# Patient Record
Sex: Male | Born: 1970 | Race: White | Hispanic: No | Marital: Married | Smoking: Former smoker
Health system: Southern US, Community
[De-identification: ages and names within clinical notes are randomized; demographics above are authoritative.]

## PROBLEM LIST (undated history)

## (undated) DIAGNOSIS — E785 Hyperlipidemia, unspecified: Secondary | ICD-10-CM

## (undated) DIAGNOSIS — N138 Other obstructive and reflux uropathy: Secondary | ICD-10-CM

## (undated) DIAGNOSIS — I10 Essential (primary) hypertension: Secondary | ICD-10-CM

## (undated) DIAGNOSIS — N401 Enlarged prostate with lower urinary tract symptoms: Secondary | ICD-10-CM

## (undated) DIAGNOSIS — E291 Testicular hypofunction: Secondary | ICD-10-CM

## (undated) HISTORY — PX: FOOT FRACTURE SURGERY: SHX645

## (undated) HISTORY — DX: Hyperlipidemia, unspecified: E78.5

## (undated) HISTORY — DX: Testicular hypofunction: E29.1

## (undated) HISTORY — DX: Essential (primary) hypertension: I10

## (undated) HISTORY — DX: Other obstructive and reflux uropathy: N13.8

## (undated) HISTORY — DX: Benign prostatic hyperplasia with lower urinary tract symptoms: N40.1

## (undated) HISTORY — PX: FRACTURE SURGERY: SHX138

## (undated) HISTORY — DX: Benign prostatic hyperplasia with lower urinary tract symptoms: N13.8

---

## 2006-09-20 ENCOUNTER — Emergency Department: Payer: Self-pay | Admitting: Emergency Medicine

## 2006-09-23 ENCOUNTER — Ambulatory Visit: Payer: Self-pay

## 2015-08-02 ENCOUNTER — Encounter: Payer: Self-pay | Admitting: *Deleted

## 2015-08-15 ENCOUNTER — Ambulatory Visit: Payer: Self-pay | Admitting: Urology

## 2015-08-21 ENCOUNTER — Ambulatory Visit (INDEPENDENT_AMBULATORY_CARE_PROVIDER_SITE_OTHER): Payer: BLUE CROSS/BLUE SHIELD | Admitting: Urology

## 2015-08-21 ENCOUNTER — Encounter: Payer: Self-pay | Admitting: Urology

## 2015-08-21 VITALS — BP 131/82 | HR 73 | Ht 68.0 in | Wt 167.7 lb

## 2015-08-21 DIAGNOSIS — N529 Male erectile dysfunction, unspecified: Secondary | ICD-10-CM

## 2015-08-21 DIAGNOSIS — N401 Enlarged prostate with lower urinary tract symptoms: Secondary | ICD-10-CM | POA: Diagnosis not present

## 2015-08-21 DIAGNOSIS — E291 Testicular hypofunction: Secondary | ICD-10-CM

## 2015-08-21 DIAGNOSIS — N138 Other obstructive and reflux uropathy: Secondary | ICD-10-CM

## 2015-08-21 MED ORDER — TESTOSTERONE 50 MG/5GM (1%) TD GEL: 5.0000 g | Freq: Every day | TRANSDERMAL | Status: DC

## 2015-08-21 NOTE — Progress Notes (Signed)
08/21/2015 10:23 AM   Benjamin Mitchell [birth year] [id number]  Referring provider: No referring provider defined for this encounter.  Chief Complaint  Patient presents with  . Benign Prostatic Hypertrophy    6 month follow up  . Erectile Dysfunction    HPI: Patient is a 44 year old white male with hypogonadism, erectile dysfunction and BPH with LUTS who presents today for 6 month follow-up.  Hypogonadism Patient presented with the symptoms of reduced libido and erectile dysfunction.  He is also experiencing a reduced incidence of spontaneous erections, a decrease in physical and work performance and a decreased energy and motivation.  This is indicated by his responses to the ADAM questionnaire.  His pretreatment testosterone level was 231 ng/dL on 08/13/2013.  He is currently managing his hypogonadism with AndroGel, 1 packet daily.         Androgen Deficiency in the Aging Male      08/21/15 1300       Androgen Deficiency in the Aging Male   Do you have a decrease in libido (sex drive) No     Do you have lack of energy Yes     Do you have a decrease in strength and/or endurance Yes     Have you lost height No     Have you noticed a decreased "enjoyment of life" No     Are you sad and/or grumpy No     Are your erections less strong No     Have you noticed a recent deterioration in your ability to play sports No     Are you falling asleep after dinner No     Has there been a recent deterioration in your work performance No         Erectile dysfunction His SHIM score is 21, which is mild ED.   He has been having difficulty with erections for two years.   His major complaint is achieving erections.  His libido is preserved.   His risk factors for ED are former smoker, age, BPH, hypogonadism, hyperlipidemia and HTN.  He denies any painful erections or curvatures with his erections.         SHIM      08/21/15 1344       SHIM: Over the last 6 months:   How do you rate  your confidence that you could get and keep an erection? Moderate     When you had erections with sexual stimulation, how often were your erections hard enough for penetration (entering your partner)? Almost Always or Always     During sexual intercourse, how often were you able to maintain your erection after you had penetrated (entered) your partner? Slightly Difficult     During sexual intercourse, how difficult was it to maintain your erection to completion of intercourse? Not Difficult     When you attempted sexual intercourse, how often was it satisfactory for you? Slightly Difficult     SHIM Total Score   SHIM 21        Score: 1-7 Severe ED 8-11 Moderate ED 12-16 Mild-Moderate ED 17-21 Mild ED 22-25 No ED   BPH WITH LUTS His major complaint today nocturia x 2.  He has had these symptoms for several years.  He denies any dysuria, hematuria or suprapubic pain.  He also denies any recent fevers, chills, nausea or vomiting.  He does not have a family history of PCa.  PMH: Past Medical History  Diagnosis Date  . HLD (hyperlipidemia)   .  Hypogonadism in male   . BPH with obstruction/lower urinary tract symptoms   . HTN (hypertension)     Surgical History: Past Surgical History  Procedure Laterality Date  . Foot fracture surgery Bilateral     Home Medications:    Medication List       This list is accurate as of: 08/21/15 11:59 PM.  Always use your most recent med list.               losartan-hydrochlorothiazide 100-25 MG tablet  Commonly known as:  HYZAAR     rosuvastatin 40 MG tablet  Commonly known as:  CRESTOR  Take 40 mg by mouth daily.     testosterone 50 MG/5GM (1%) Gel  Commonly known as:  ANDROGEL  Place 5 g onto the skin daily.        Allergies: No Known Allergies  Family History: Family History  Problem Relation Age of Onset  . Stroke Father   . Kidney disease Neg Hx   . Prostate cancer Neg Hx   . Breast cancer Mother     Social  History:  reports that he has quit smoking. He does not have any smokeless tobacco history on file. He reports that he drinks alcohol. He reports that he does not use illicit drugs.  ROS: UROLOGY Frequent Urination?: No Hard to postpone urination?: No Burning/pain with urination?: No Get up at night to urinate?: No Leakage of urine?: No Urine stream starts and stops?: No Trouble starting stream?: No Do you have to strain to urinate?: No Blood in urine?: No Urinary tract infection?: No Sexually transmitted disease?: No Injury to kidneys or bladder?: No Painful intercourse?: No Weak stream?: No Erection problems?: No Penile pain?: No  Gastrointestinal Nausea?: No Vomiting?: No Indigestion/heartburn?: No Diarrhea?: No Constipation?: No  Constitutional Fever: No Night sweats?: No Weight loss?: No Fatigue?: No  Skin Skin rash/lesions?: No Itching?: No  Eyes Blurred vision?: No Double vision?: No  Ears/Nose/Throat Sore throat?: No Sinus problems?: No  Hematologic/Lymphatic Swollen glands?: No Easy bruising?: No  Cardiovascular Leg swelling?: No Chest pain?: No  Respiratory Cough?: No Shortness of breath?: No  Endocrine Excessive thirst?: No  Musculoskeletal Back pain?: No Joint pain?: No  Neurological Headaches?: No Dizziness?: No  Psychologic Depression?: No Anxiety?: No  Physical Exam: BP 131/82 mmHg  Pulse 73  Ht 5\' 8"  (1.727 m)  Wt 167 lb 11.2 oz (76.068 kg)  BMI 25.50 kg/m2  GU: Patient with circumcised phallus.  Urethral meatus is patent.  No penile discharge. No penile lesions or rashes. Scrotum without lesions, cysts, rashes and/or edema.  Testicles are located scrotally bilaterally. No masses are appreciated in the testicles. Left and right epididymis are normal. Rectal: Patient with  normal sphincter tone. Perineum without scarring or rashes. No rectal masses are appreciated. Prostate is approximately 45 grams,  nodules are  appreciated. Seminal vesicles are normal.  Laboratory Data: PSA History:  1.0 ng/mL on 08/03/2013  1.2 ng/mL on 02/01/2014  1.4 ng/mL on 08/03/2014  1.4 ng/mL on 02/02/2015    Assessment & Plan:    1. BPH (benign prostatic hyperplasia) with LUTS:   Patient has nocturia x 2.  This has been his baseline for the last several years.  He is not interested in starting any medical treatment at this time.  He will RTC in 6 months for IPSS score, PSA and exam.  - PSA  2. Erectile dysfunction:   Patient's SHIM score is 21.  He will RTC  in 6 months for SHIM score and exam.    3. Hypogonadism:   Patient will continue the AndroGel, 1 packet daily. A refill sent to his pharmacy. He will RTC in 6 months for ADAM questionnaire and exam.  - Testosterone - Hematocrit   Return in about 6 months (around 02/19/2016) for IPSS, SHIM and ADAM.  Zara Council, [place] [address], [place], [place] [address] [phone]

## 2015-08-22 LAB — TESTOSTERONE: TESTOSTERONE: 283 ng/dL — AB (ref 348–1197)

## 2015-08-22 LAB — HEMATOCRIT: Hematocrit: 40.2 % (ref 37.5–51.0)

## 2015-08-22 LAB — PSA: PROSTATE SPECIFIC AG, SERUM: 1.3 ng/mL (ref 0.0–4.0)

## 2015-08-23 DIAGNOSIS — N401 Enlarged prostate with lower urinary tract symptoms: Principal | ICD-10-CM

## 2015-08-23 DIAGNOSIS — N529 Male erectile dysfunction, unspecified: Secondary | ICD-10-CM | POA: Insufficient documentation

## 2015-08-23 DIAGNOSIS — N138 Other obstructive and reflux uropathy: Secondary | ICD-10-CM | POA: Insufficient documentation

## 2015-08-23 DIAGNOSIS — E291 Testicular hypofunction: Secondary | ICD-10-CM | POA: Insufficient documentation

## 2015-08-24 ENCOUNTER — Telehealth: Payer: Self-pay

## 2015-08-24 DIAGNOSIS — E291 Testicular hypofunction: Secondary | ICD-10-CM

## 2015-08-24 NOTE — Telephone Encounter (Signed)
Letter was sent stating we need to know if medication needs to be refilled, labs need to be drawn in a month(orders placed), and an updated number.

## 2015-08-24 NOTE — Telephone Encounter (Signed)
-----   Message from Nori Riis, PA-C sent at 08/23/2015  1:49 PM EDT ----- PSA is good.  His testosterone is low.  I believe he said he had been out of the medication.  We can check it again in one month before 9 AM with a HCT.

## 2015-08-24 NOTE — Telephone Encounter (Signed)
Line disconnected. Will send a letter.

## 2015-09-13 ENCOUNTER — Other Ambulatory Visit: Payer: Self-pay

## 2015-09-13 DIAGNOSIS — E291 Testicular hypofunction: Secondary | ICD-10-CM

## 2015-09-13 MED ORDER — TESTOSTERONE 50 MG/5GM (1%) TD GEL: 5.0000 g | Freq: Every day | TRANSDERMAL | Status: DC

## 2015-09-18 ENCOUNTER — Other Ambulatory Visit: Payer: BLUE CROSS/BLUE SHIELD

## 2015-09-24 ENCOUNTER — Other Ambulatory Visit: Payer: BLUE CROSS/BLUE SHIELD

## 2015-09-24 DIAGNOSIS — E291 Testicular hypofunction: Secondary | ICD-10-CM

## 2015-09-25 LAB — HEMATOCRIT: HEMATOCRIT: 42.2 % (ref 37.5–51.0)

## 2015-09-25 LAB — TESTOSTERONE: TESTOSTERONE: 307 ng/dL — AB (ref 348–1197)

## 2015-10-03 ENCOUNTER — Telehealth: Payer: Self-pay

## 2015-10-03 DIAGNOSIS — E291 Testicular hypofunction: Secondary | ICD-10-CM

## 2015-10-03 MED ORDER — CLOMIPHENE CITRATE 50 MG PO TABS: ORAL_TABLET | ORAL | Status: DC

## 2015-10-03 NOTE — Telephone Encounter (Signed)
No.  We can call that medication in and he will need a morning testosterone draw in one month.  Clomid 50 mg, 1/2 tablet daily #30. No refills at this time.

## 2015-10-03 NOTE — Telephone Encounter (Signed)
LMOM-medication called into pharmacy 

## 2015-10-03 NOTE — Telephone Encounter (Signed)
Spoke with pt in reference testosterone. Pt stated he would like to give clomid a try. Does he needs labs or can medication be called in?

## 2015-10-03 NOTE — Telephone Encounter (Signed)
-----   Message from Nori Riis, PA-C sent at 10/02/2015  5:07 PM EST ----- Testosterone is still low.  He may want to try Clomid.  I have had success with that medication when testosterone therapy fails.

## 2015-12-25 ENCOUNTER — Telehealth: Payer: Self-pay | Admitting: Urology

## 2015-12-25 NOTE — Telephone Encounter (Signed)
I received a med refill error for his Clomid.  Did he get that medication?

## 2015-12-26 NOTE — Telephone Encounter (Signed)
LMOM

## 2015-12-26 NOTE — Telephone Encounter (Signed)
Spoke with pt in reference to clomid. Pt stated he will be picking up medication this afternoon. Made aware we received an error message and if medication is not at pharmacy to let us know. Pt voiced understanding.

## 2015-12-27 ENCOUNTER — Other Ambulatory Visit: Payer: Self-pay

## 2015-12-27 DIAGNOSIS — E291 Testicular hypofunction: Secondary | ICD-10-CM

## 2015-12-27 MED ORDER — CLOMIPHENE CITRATE 50 MG PO TABS: ORAL_TABLET | ORAL | Status: DC

## 2015-12-27 NOTE — Progress Notes (Signed)
Pt called and stated [facility] did not receive medication script. Pt requested medication be sent to [facility]. Medication was sent and pt made aware.

## 2016-02-12 ENCOUNTER — Other Ambulatory Visit: Payer: BLUE CROSS/BLUE SHIELD

## 2016-02-12 DIAGNOSIS — E291 Testicular hypofunction: Secondary | ICD-10-CM

## 2016-02-13 LAB — TESTOSTERONE: TESTOSTERONE: 507 ng/dL (ref 348–1197)

## 2016-02-13 LAB — HEMATOCRIT: Hematocrit: 41.3 % (ref 37.5–51.0)

## 2016-02-19 ENCOUNTER — Encounter: Payer: Self-pay | Admitting: Urology

## 2016-02-19 ENCOUNTER — Ambulatory Visit (INDEPENDENT_AMBULATORY_CARE_PROVIDER_SITE_OTHER): Payer: BLUE CROSS/BLUE SHIELD | Admitting: Urology

## 2016-02-19 VITALS — BP 137/89 | HR 72 | Ht 68.0 in | Wt 165.4 lb

## 2016-02-19 DIAGNOSIS — I10 Essential (primary) hypertension: Secondary | ICD-10-CM | POA: Insufficient documentation

## 2016-02-19 DIAGNOSIS — N401 Enlarged prostate with lower urinary tract symptoms: Secondary | ICD-10-CM | POA: Diagnosis not present

## 2016-02-19 DIAGNOSIS — E291 Testicular hypofunction: Secondary | ICD-10-CM | POA: Diagnosis not present

## 2016-02-19 DIAGNOSIS — N529 Male erectile dysfunction, unspecified: Secondary | ICD-10-CM

## 2016-02-19 DIAGNOSIS — E785 Hyperlipidemia, unspecified: Secondary | ICD-10-CM | POA: Insufficient documentation

## 2016-02-19 DIAGNOSIS — N138 Other obstructive and reflux uropathy: Secondary | ICD-10-CM

## 2016-02-19 DIAGNOSIS — Z8781 Personal history of (healed) traumatic fracture: Secondary | ICD-10-CM | POA: Insufficient documentation

## 2016-02-19 MED ORDER — CLOMIPHENE CITRATE 50 MG PO TABS: ORAL_TABLET | ORAL | Status: DC

## 2016-02-19 NOTE — Progress Notes (Signed)
9:04 AM   Benjamin Mitchell Feb 07, 1971 [id number]  Referring provider: Idelle Crouch, MD [facility] [place], [place] [address]  Chief Complaint  Patient presents with  . Follow-up    BPH, hypogonadism    HPI: Patient is a 45 year old Caucasian male with hypogonadism, erectile dysfunction and BPH with LUTS who presents today for 6 month follow-up.   Hypogonadism Patient is experiencing a lack of energy.   This is indicated by his responses to the ADAM questionnaire.  His pretreatment testosterone level was 231 ng/dL on 08/13/2013.   He is currently managing his hypogonadism with AndroGel, 1 packet daily.          Androgen Deficiency in the Aging Male      02/19/16 0800       Androgen Deficiency in the Aging Male   Do you have a decrease in libido (sex drive) No     Do you have lack of energy Yes     Do you have a decrease in strength and/or endurance No     Have you lost height No     Have you noticed a decreased "enjoyment of life" No     Are you sad and/or grumpy No     Are your erections less strong No     Have you noticed a recent deterioration in your ability to play sports No     Are you falling asleep after dinner No     Has there been a recent deterioration in your work performance No        Erectile dysfunction His SHIM score is 23, which is no ED.   He has been having difficulty with erections for two years.   His major complaint is achieving erections.  His libido is preserved.   His risk factors for ED are former smoker, age, BPH, hypogonadism, hyperlipidemia and HTN.  He denies any painful erections or curvatures with his erections.         SHIM      02/19/16 0854       SHIM: Over the last 6 months:   How do you rate your confidence that you could get and keep an erection? Moderate     When you had erections with sexual stimulation, how often were your erections hard enough for penetration (entering your partner)? Almost  Always or Always     During sexual intercourse, how often were you able to maintain your erection after you had penetrated (entered) your partner? Not Difficult     During sexual intercourse, how difficult was it to maintain your erection to completion of intercourse? Not Difficult     When you attempted sexual intercourse, how often was it satisfactory for you? Not Difficult     SHIM Total Score   SHIM 23        Score: 1-7 Severe ED 8-11 Moderate ED 12-16 Mild-Moderate ED 17-21 Mild ED 22-25 No ED   BPH WITH LUTS His IPSS score today is 3, which is mild lower urinary tract symptomatology. He is delighted with his quality life due to his urinary symptoms.  His major complaint today nocturia x 2.  He has had these symptoms for as long as he can remember.  He denies any dysuria, hematuria or suprapubic pain.  He also denies any recent fevers, chills, nausea or vomiting.  He does not have a family history of PCa.      IPSS  02/19/16 0800       International Prostate Symptom Score   How often have you had the sensation of not emptying your bladder? Not at All     How often have you had to urinate less than every two hours? Less than 1 in 5 times     How often have you found you stopped and started again several times when you urinated? Not at All     How often have you found it difficult to postpone urination? Not at All     How often have you had a weak urinary stream? Not at All     How often have you had to strain to start urination? Not at All     How many times did you typically get up at night to urinate? 2 Times     Total IPSS Score 3     Quality of Life due to urinary symptoms   If you were to spend the rest of your life with your urinary condition just the way it is now how would you feel about that? Delighted        Score:  1-7 Mild 8-19 Moderate 20-35 Severe   PMH: Past Medical History  Diagnosis Date  . HLD (hyperlipidemia)   . Hypogonadism in male   .  BPH with obstruction/lower urinary tract symptoms   . HTN (hypertension)     Surgical History: Past Surgical History  Procedure Laterality Date  . Foot fracture surgery Bilateral     Home Medications:    Medication List       This list is accurate as of: 02/19/16 11:59 PM.  Always use your most recent med list.               clomiPHENE 50 MG tablet  Commonly known as:  CLOMID  1/2 tab daily     losartan-hydrochlorothiazide 100-25 MG tablet  Commonly known as:  HYZAAR  Reported on 02/19/2016     losartan-hydrochlorothiazide 100-12.5 MG tablet  Commonly known as:  HYZAAR  Take 1 tablet by mouth daily.     rosuvastatin 40 MG tablet  Commonly known as:  CRESTOR  Take 40 mg by mouth daily.     testosterone 50 MG/5GM (1%) Gel  Commonly known as:  ANDROGEL  Place 5 g onto the skin daily.        Allergies: No Known Allergies  Family History: Family History  Problem Relation Age of Onset  . Stroke Father   . Kidney disease Neg Hx   . Prostate cancer Neg Hx   . Breast cancer Mother     Social History:  reports that he has quit smoking. He does not have any smokeless tobacco history on file. He reports that he drinks alcohol. He reports that he does not use illicit drugs.  ROS: UROLOGY Frequent Urination?: No Hard to postpone urination?: No Burning/pain with urination?: No Get up at night to urinate?: No Leakage of urine?: No Urine stream starts and stops?: No Trouble starting stream?: No Do you have to strain to urinate?: No Blood in urine?: No Urinary tract infection?: No Sexually transmitted disease?: No Injury to kidneys or bladder?: No Painful intercourse?: No Weak stream?: No Erection problems?: No Penile pain?: No  Gastrointestinal Nausea?: No Vomiting?: No Indigestion/heartburn?: No Diarrhea?: No Constipation?: No  Constitutional Fever: No Night sweats?: No Weight loss?: No Fatigue?: No  Skin Skin rash/lesions?: No Itching?:  No  Eyes Blurred vision?: No Double vision?: No  Ears/Nose/Throat Sore throat?: No Sinus problems?: No  Hematologic/Lymphatic Swollen glands?: No Easy bruising?: No  Cardiovascular Leg swelling?: No Chest pain?: No  Respiratory Cough?: No Shortness of breath?: No  Endocrine Excessive thirst?: No  Musculoskeletal Back pain?: No Joint pain?: No  Neurological Headaches?: No Dizziness?: No  Psychologic Depression?: No Anxiety?: No  Physical Exam: BP 137/89 mmHg  Pulse 72  Ht 5\' 8"  (1.727 m)  Wt 165 lb 6.4 oz (75.025 kg)  BMI 25.15 kg/m2  GU: Patient with circumcised phallus.  Urethral meatus is patent.  No penile discharge. No penile lesions or rashes. Scrotum without lesions, cysts, rashes and/or edema.  Testicles are located scrotally bilaterally. No masses are appreciated in the testicles. Left and right epididymis are normal. Rectal: Patient with  normal sphincter tone. Perineum without scarring or rashes. No rectal masses are appreciated. Prostate is approximately 45 grams,  nodules are appreciated. Seminal vesicles are normal.  Laboratory Data: PSA History:  1.0 ng/mL on 08/03/2013  1.2 ng/mL on 02/01/2014  1.4 ng/mL on 08/03/2014  1.4 ng/mL on 02/02/2015  1.3 ng/mL on 08/21/2015  1.7 ng/mL on 02/19/2016      Assessment & Plan:    1. BPH (benign prostatic hyperplasia) with LUTS:   IPSS score is 3/0.  Patient has nocturia x 2.  This has been his baseline for the last several years.  He is not interested in starting any medical treatment at this time.  We will continue to monitor as testosterone therapy can increase the size of the prostate and potentially worsen LUTS.  He will RTC in 6 months for IPSS score, PSA and exam.  - PSA  2. Erectile dysfunction:   Patient's SHIM score is 23.  We will continue to monitor as testosterone therapy can affect ED.  He will RTC in 6 months for SHIM score and exam.    3. Hypogonadism:   Patient's testosterone levels  have not been in the therapeutic range.  He would like to try another treatment modality.  I did mention that some men have had success using clomid for hypogonadism.  It does seem to be more successful in younger men, but there are incidences of good results in middle-aged men.  I explained that it is used in male infertility to stimulate the testicles to make more testosterone/sperm.  There has been no long term data on side effects, but some urologists has been having success with this medication.  His LFT's are normal.  He would like to start Clomid therapy at this time.  I have sent a prescription for Clomid 50 mg, 1/2 tablet daily to his pharmacy.  He will RTC after thirty days of Clomid for a morning testosterone draw before 9 AM.      - Testosterone - Hematocrit   Return in about 1 month (around 03/20/2016) for for morning testosterone before 9 AM.  Zara Council, [facility] [address] [place], [place] [address] [phone]

## 2016-02-21 ENCOUNTER — Telehealth: Payer: Self-pay

## 2016-02-21 NOTE — Telephone Encounter (Signed)
LMOM- labs are normal. 

## 2016-02-21 NOTE — Telephone Encounter (Signed)
-----   Message from Nori Riis, PA-C sent at 02/20/2016  8:09 AM EDT ----- Labs are normal.

## 2016-02-26 LAB — ESTRADIOL, FREE
Estradiol, Serum, MS: 32 pg/mL
Free Estradiol, Percent: 3.2 %
Free Estradiol, Serum: 1 pg/mL

## 2016-02-26 LAB — PSA: Prostate Specific Ag, Serum: 1.7 ng/mL (ref 0.0–4.0)

## 2016-02-26 LAB — TSH: TSH: 2.33 u[IU]/mL (ref 0.450–4.500)

## 2016-08-15 ENCOUNTER — Other Ambulatory Visit: Payer: Self-pay

## 2016-08-15 DIAGNOSIS — N400 Enlarged prostate without lower urinary tract symptoms: Secondary | ICD-10-CM

## 2016-08-15 DIAGNOSIS — E291 Testicular hypofunction: Secondary | ICD-10-CM

## 2016-08-18 ENCOUNTER — Other Ambulatory Visit: Payer: BLUE CROSS/BLUE SHIELD

## 2016-08-18 DIAGNOSIS — E291 Testicular hypofunction: Secondary | ICD-10-CM

## 2016-08-19 LAB — HEPATIC FUNCTION PANEL
ALT: 21 IU/L (ref 0–44)
AST: 17 IU/L (ref 0–40)
Albumin: 4.5 g/dL (ref 3.5–5.5)
Alkaline Phosphatase: 48 IU/L (ref 39–117)
BILIRUBIN TOTAL: 0.5 mg/dL (ref 0.0–1.2)
BILIRUBIN, DIRECT: 0.14 mg/dL (ref 0.00–0.40)
Total Protein: 7.2 g/dL (ref 6.0–8.5)

## 2016-08-19 LAB — TESTOSTERONE: TESTOSTERONE: 626 ng/dL (ref 264–916)

## 2016-08-19 LAB — PSA: Prostate Specific Ag, Serum: 1.5 ng/mL (ref 0.0–4.0)

## 2016-08-19 LAB — HEMATOCRIT: Hematocrit: 43.1 % (ref 37.5–51.0)

## 2016-08-20 ENCOUNTER — Ambulatory Visit: Payer: BLUE CROSS/BLUE SHIELD | Admitting: Urology

## 2016-08-20 ENCOUNTER — Encounter: Payer: Self-pay | Admitting: Urology

## 2016-08-20 VITALS — BP 148/87 | HR 75 | Ht 68.0 in | Wt 167.3 lb

## 2016-08-20 DIAGNOSIS — E291 Testicular hypofunction: Secondary | ICD-10-CM

## 2016-08-20 DIAGNOSIS — N138 Other obstructive and reflux uropathy: Secondary | ICD-10-CM

## 2016-08-20 DIAGNOSIS — N529 Male erectile dysfunction, unspecified: Secondary | ICD-10-CM | POA: Diagnosis not present

## 2016-08-20 DIAGNOSIS — N401 Enlarged prostate with lower urinary tract symptoms: Secondary | ICD-10-CM | POA: Diagnosis not present

## 2016-08-20 MED ORDER — CLOMIPHENE CITRATE 50 MG PO TABS: ORAL_TABLET | ORAL | 6 refills | Status: DC

## 2016-08-20 NOTE — Progress Notes (Signed)
9:07 AM   Benjamin Mitchell 1970/12/24 RE:[id number]  Referring provider: Idelle Crouch, MD [facility] [place], [place] [address]  Chief Complaint  Patient presents with  . Hypogonadism    6 month follow up    HPI: Patient is a 45 year old Caucasian male with hypogonadism, erectile dysfunction and BPH with LUTS who presents today for 6 month follow-up.  Hypogonadism Patient is experiencing erections being less strong.   This is indicated by his responses to the ADAM questionnaire.  He is still having spontaneous erections at night.  He does not have sleep apnea.   His current testosterone level was 626 ng/dL on 08/18/2016.  He is currently managing his hypogonadism with Clomid 50 mg, 1/2 daily.        Androgen Deficiency in the Aging Male    [place] Name 08/20/16 0900         Androgen Deficiency in the Aging Male   Do you have a decrease in libido (sex drive) No     Do you have lack of energy No     Do you have a decrease in strength and/or endurance No     Have you lost height No     Have you noticed a decreased "enjoyment of life" No     Are you sad and/or grumpy No     Are your erections less strong Yes     Have you noticed a recent deterioration in your ability to play sports No     Are you falling asleep after dinner No     Has there been a recent deterioration in your work performance No        Erectile dysfunction His SHIM score is 21, which is mild ED.   His previous SHIM was 23.  He has been having difficulty with erections for two years.   His major complaint is achieving erections.  His libido is preserved.   His risk factors for ED are former smoker, age, BPH, hypogonadism, hyperlipidemia and HTN.  He denies any painful erections or curvatures with his erections.         SHIM    Row Name 08/20/16 0857         SHIM: Over the last 6 months:   How do you rate your confidence that you could get and keep an erection? Moderate     When you had erections with sexual stimulation, how often were your erections hard enough for penetration (entering your partner)? Most Times (much more than half the time)     During sexual intercourse, how often were you able to maintain your erection after you had penetrated (entered) your partner? Slightly Difficult     During sexual intercourse, how difficult was it to maintain your erection to completion of intercourse? Not Difficult     When you attempted sexual intercourse, how often was it satisfactory for you? Not Difficult       SHIM Total Score   SHIM 21        Score: 1-7 Severe ED 8-11 Moderate ED 12-16 Mild-Moderate ED 17-21 Mild ED 22-25 No ED   BPH WITH LUTS His IPSS score today is 5, which is mild lower urinary tract symptomatology. He is pleased with his quality life due to his urinary symptoms. His previous IPSS score was 3/0.   His major complaint today nocturia x 2.  He has had these symptoms for as long as he can remember.  He denies any dysuria, hematuria or suprapubic pain.  He also denies any recent fevers, chills, nausea or vomiting.  He does not have a family history of PCa.      IPSS    Row Name 08/20/16 0800         International Prostate Symptom Score   How often have you had the sensation of not emptying your bladder? Not at All     How often have you had to urinate less than every two hours? Less than half the time     How often have you found you stopped and started again several times when you urinated? Not at All     How often have you found it difficult to postpone urination? Not at All     How often have you had a weak urinary stream? Less than 1 in 5 times     How often have you had to strain to start urination? Not at All     How many times did you typically get up at night to urinate? 2 Times     Total IPSS Score 5       Quality of Life due to urinary symptoms   If you were to spend the rest of your life with your urinary condition just  the way it is now how would you feel about that? Pleased        Score:  1-7 Mild 8-19 Moderate 20-35 Severe   PMH: Past Medical History:  Diagnosis Date  . BPH with obstruction/lower urinary tract symptoms   . HLD (hyperlipidemia)   . HTN (hypertension)   . Hypogonadism in male     Surgical History: Past Surgical History:  Procedure Laterality Date  . FOOT FRACTURE SURGERY Bilateral     Home Medications:    Medication List       Accurate as of 08/20/16  9:07 AM. Always use your most recent med list.          clomiPHENE 50 MG tablet Commonly known as:  CLOMID 1/2 tab daily   losartan-hydrochlorothiazide 100-25 MG tablet Commonly known as:  HYZAAR Reported on 02/19/2016   losartan-hydrochlorothiazide 100-12.5 MG tablet Commonly known as:  HYZAAR Take 1 tablet by mouth daily.   rosuvastatin 40 MG tablet Commonly known as:  CRESTOR Take 40 mg by mouth daily.   testosterone 50 MG/5GM (1%) Gel Commonly known as:  ANDROGEL Place 5 g onto the skin daily.       Allergies: No Known Allergies  Family History: Family History  Problem Relation Age of Onset  . Stroke Father   . Breast cancer Mother   . Kidney disease Neg Hx   . Prostate cancer Neg Hx     Social History:  reports that he has quit smoking. He has quit using smokeless tobacco. He reports that he drinks alcohol. He reports that he does not use drugs.  ROS: UROLOGY Frequent Urination?: No Hard to postpone urination?: No Burning/pain with urination?: No Get up at night to urinate?: No Leakage of urine?: No Urine stream starts and stops?: No Trouble starting stream?: No Do you have to strain to urinate?: No Blood in urine?: No Urinary tract infection?: No Sexually transmitted disease?: No Injury to kidneys or bladder?: No Painful intercourse?: No Weak stream?: No Erection problems?: No Penile pain?: No  Gastrointestinal Nausea?: No Vomiting?: No Indigestion/heartburn?:  No Diarrhea?: No Constipation?: No  Constitutional Fever: No Night sweats?: No Weight loss?: No Fatigue?: No  Skin Skin rash/lesions?: No Itching?: No  Eyes Blurred vision?: No Double vision?: No  Ears/Nose/Throat Sore throat?: No Sinus problems?: No  Hematologic/Lymphatic Swollen glands?: No Easy bruising?: No  Cardiovascular Leg swelling?: No Chest pain?: No  Respiratory Cough?: No Shortness of breath?: No  Endocrine Excessive thirst?: No  Musculoskeletal Back pain?: No Joint pain?: No  Neurological Headaches?: No Dizziness?: No  Psychologic Depression?: No Anxiety?: No  Physical Exam: BP (!) 148/87   Pulse 75   Ht 5\' 8"  (1.727 m)   Wt 167 lb 4.8 oz (75.9 kg)   BMI 25.44 kg/m   GU: Patient with circumcised phallus.  Urethral meatus is patent.  No penile discharge. No penile lesions or rashes. Scrotum without lesions, cysts, rashes and/or edema.  Testicles are located scrotally bilaterally. No masses are appreciated in the testicles. Left and right epididymis are normal. Rectal: Patient with  normal sphincter tone. Perineum without scarring or rashes. No rectal masses are appreciated. Prostate is approximately 45 grams,  nodules are appreciated. Seminal vesicles are normal.  Laboratory Data: PSA History:  1.0 ng/mL on 08/03/2013  1.2 ng/mL on 02/01/2014  1.4 ng/mL on 08/03/2014  1.4 ng/mL on 02/02/2015  1.3 ng/mL on 08/21/2015  1.7 ng/mL on 02/19/2016  1.5 ng/mL on 08/18/2016    Assessment & Plan:    1. Hypogonadism:     -most recent testosterone level is 626 ng/dL on 08/18/2016; LFT's normal  -continue Clomid 50 mg, 1/2 tablet daily-refills given today  -RTC in 6 months for HCT, testosterone, PSA, LFT's, ADAM and exam  2. BPH with LUTS  - IPSS score is 5/1, it is worsening  - Continue conservative management, avoiding bladder irritants and timed voiding's  - PSA 1.5 and stable  - RTC in 6 months for IPSS, PSA and exam, as testosterone  therapy can cause prostate enlargement and worsen LUTS  3. Erectile dysfunction:     -SHIM score is 21, it is worsening  -RTC in 6 months for SHIM score and exam, as testosterone therapy can affect erections  Return for PSA, LFT's, HCT, testosterone (before 10 AM), ADAM, IPSS, SHIM and exam.  Zara Council, [facility] [address] [place], [place] [address] [phone]

## 2017-02-11 ENCOUNTER — Other Ambulatory Visit: Payer: Self-pay

## 2017-02-11 ENCOUNTER — Other Ambulatory Visit: Payer: BLUE CROSS/BLUE SHIELD

## 2017-02-11 DIAGNOSIS — E291 Testicular hypofunction: Secondary | ICD-10-CM

## 2017-02-12 LAB — HEPATIC FUNCTION PANEL
ALBUMIN: 4.4 g/dL (ref 3.5–5.5)
ALT: 20 IU/L (ref 0–44)
AST: 20 IU/L (ref 0–40)
Alkaline Phosphatase: 44 IU/L (ref 39–117)
BILIRUBIN TOTAL: 0.4 mg/dL (ref 0.0–1.2)
BILIRUBIN, DIRECT: 0.13 mg/dL (ref 0.00–0.40)
Total Protein: 7.1 g/dL (ref 6.0–8.5)

## 2017-02-12 LAB — HEMATOCRIT: Hematocrit: 43.5 % (ref 37.5–51.0)

## 2017-02-12 LAB — TESTOSTERONE: Testosterone: 597 ng/dL (ref 264–916)

## 2017-02-12 LAB — PSA: Prostate Specific Ag, Serum: 1.8 ng/mL (ref 0.0–4.0)

## 2017-02-17 NOTE — Progress Notes (Signed)
8:59 AM   Benjamin Mitchell Aug 19, 1971 [id number]  Referring provider: Idelle Crouch, MD [place] [facility] [place], [place] [address]  Chief Complaint  Patient presents with  . Benign Prostatic Hypertrophy    HPI: Patient is a 46 year old Caucasian male with hypogonadism, erectile dysfunction and BPH with LUTS who presents today for 6 month follow-up.  Testosterone deficiency Patient is experiencing failing asleep after dinner.   This is indicated by his responses to the ADAM questionnaire.  He is still having spontaneous erections at night.  He does not have sleep apnea.   His current testosterone level was 597 ng/dL on 02/11/2017.  He is currently managing his hypogonadism with Clomid 50 mg, 1/2 daily.        Androgen Deficiency in the Aging Male    [place] Name 02/18/17 0800         Androgen Deficiency in the Aging Male   Do you have a decrease in libido (sex drive) No     Do you have lack of energy No     Do you have a decrease in strength and/or endurance No     Have you lost height No     Have you noticed a decreased "enjoyment of life" No     Are you sad and/or grumpy No     Are your erections less strong No     Have you noticed a recent deterioration in your ability to play sports No     Are you falling asleep after dinner Yes     Has there been a recent deterioration in your work performance No        Erectile dysfunction His SHIM score is 20, which is mild ED.   His previous SHIM was 21.  He has been having difficulty with erections for two years.   His major complaint is achieving erections.  His libido is preserved.   His risk factors for ED are former smoker, age, BPH, hypogonadism, hyperlipidemia and HTN.  He denies any painful erections or curvatures with his erections.         [place] Name 02/18/17 [phone]         SHIM: Over the last 6 months:   How do you rate your confidence that you could get and keep an erection? Moderate       When you had erections with sexual stimulation, how often were your erections hard enough for penetration (entering your partner)? Most Times (much more than half the time)     During sexual intercourse, how often were you able to maintain your erection after you had penetrated (entered) your partner? Most Times (much more than half the time)     During sexual intercourse, how difficult was it to maintain your erection to completion of intercourse? Not Difficult     When you attempted sexual intercourse, how often was it satisfactory for you? Most Times (much more than half the time)       SHIM Total Score   SHIM 20        Score: 1-7 Severe ED 8-11 Moderate ED 12-16 Mild-Moderate ED 17-21 Mild ED 22-25 No ED   BPH WITH LUTS His IPSS score today is 4, which is mild lower urinary tract symptomatology. He is pleased with his quality life due to his urinary symptoms. His previous IPSS score was 5/0.   His major complaint today nocturia x 2.  He has had  these symptoms for as long as he can remember.  He denies any dysuria, hematuria or suprapubic pain.  He also denies any recent fevers, chills, nausea or vomiting.  He does not have a family history of PCa.      IPSS    Row Name 02/18/17 0800         International Prostate Symptom Score   How often have you had the sensation of not emptying your bladder? Not at All     How often have you had to urinate less than every two hours? Less than 1 in 5 times     How often have you found you stopped and started again several times when you urinated? Not at All     How often have you found it difficult to postpone urination? Less than 1 in 5 times     How often have you had a weak urinary stream? Not at All     How often have you had to strain to start urination? Not at All     How many times did you typically get up at night to urinate? 2 Times     Total IPSS Score 4       Quality of Life due to urinary symptoms   If you were to spend the  rest of your life with your urinary condition just the way it is now how would you feel about that? Pleased        Score:  1-7 Mild 8-19 Moderate 20-35 Severe   PMH: Past Medical History:  Diagnosis Date  . BPH with obstruction/lower urinary tract symptoms   . HLD (hyperlipidemia)   . HTN (hypertension)   . Hypogonadism in male     Surgical History: Past Surgical History:  Procedure Laterality Date  . FOOT FRACTURE SURGERY Bilateral     Home Medications:  Allergies as of 02/18/2017   No Known Allergies     Medication List       Accurate as of 02/18/17  8:59 AM. Always use your most recent med list.          clomiPHENE 50 MG tablet Commonly known as:  CLOMID 1/2 tab daily   losartan-hydrochlorothiazide 100-12.5 MG tablet Commonly known as:  HYZAAR Take 1 tablet by mouth daily.   rosuvastatin 40 MG tablet Commonly known as:  CRESTOR Take 40 mg by mouth daily.       Allergies: No Known Allergies  Family History: Family History  Problem Relation Age of Onset  . Stroke Father   . Breast cancer Mother   . Kidney disease Neg Hx   . Prostate cancer Neg Hx     Social History:  reports that he has quit smoking. He has quit using smokeless tobacco. He reports that he drinks alcohol. He reports that he does not use drugs.  ROS: UROLOGY Frequent Urination?: No Hard to postpone urination?: No Burning/pain with urination?: No Get up at night to urinate?: No Leakage of urine?: No Urine stream starts and stops?: No Trouble starting stream?: No Do you have to strain to urinate?: No Blood in urine?: No Urinary tract infection?: No Sexually transmitted disease?: No Injury to kidneys or bladder?: No Painful intercourse?: No Weak stream?: No Erection problems?: No Penile pain?: No  Gastrointestinal Nausea?: No Vomiting?: No Indigestion/heartburn?: No Diarrhea?: No Constipation?: No  Constitutional Fever: No Night sweats?: No Weight loss?:  No Fatigue?: No  Skin Skin rash/lesions?: No Itching?: No  Eyes Blurred vision?: No  Double vision?: No  Ears/Nose/Throat Sore throat?: No Sinus problems?: No  Hematologic/Lymphatic Swollen glands?: No Easy bruising?: No  Cardiovascular Leg swelling?: No Chest pain?: No  Respiratory Cough?: No Shortness of breath?: No  Endocrine Excessive thirst?: No  Musculoskeletal Back pain?: No Joint pain?: No  Neurological Headaches?: No Dizziness?: No  Psychologic Depression?: No Anxiety?: No  Physical Exam: BP 121/78 (BP Location: Left Arm, Patient Position: Sitting, Cuff Size: Normal)   Pulse 76   Ht 5\' 8"  (1.727 m)   Wt 166 lb 9.6 oz (75.6 kg)   BMI 25.33 kg/m   GU: Patient with circumcised phallus.  Urethral meatus is patent.  No penile discharge. No penile lesions or rashes. Scrotum without lesions, cysts, rashes and/or edema.  Testicles are located scrotally bilaterally. No masses are appreciated in the testicles. Left and right epididymis are normal. Rectal: Patient with  normal sphincter tone. Perineum without scarring or rashes. No rectal masses are appreciated. Prostate is approximately 45 grams,  nodules are appreciated. Seminal vesicles are normal.  Laboratory Data: PSA History:  1.0 ng/mL on 08/03/2013  1.2 ng/mL on 02/01/2014  1.4 ng/mL on 08/03/2014  1.4 ng/mL on 02/02/2015  1.3 ng/mL on 08/21/2015  1.7 ng/mL on 02/19/2016  1.5 ng/mL on 08/18/2016  1.8 ng/mL on 02/11/2017    Assessment & Plan:    1. Testosterone deficiency:     -most recent testosterone level is 597 ng/dL on 08/18/2016; LFT's and HCT normal  -continue Clomid 50 mg, 1/2 tablet daily-refills given today  -RTC in 6 months for HCT, testosterone, PSA, LFT's, ADAM and exam  2. BPH with LUTS  - IPSS score is 4/1, it is improving  - Continue conservative management, avoiding bladder irritants and timed voiding's  - PSA 1.8 and stable  - RTC in 6 months for IPSS, PSA and exam, as  testosterone therapy can cause prostate enlargement and worsen LUTS  3. Erectile dysfunction:     -SHIM score is 20, it is worsening  -RTC in 6 months for SHIM score and exam, as testosterone therapy can affect erections  Return in about 6 months (around 08/20/2017) for PSA, LFT's, HCT, testosterone (before 10 AM), ADAM, IPSS, SHIM and exam.  Zara Council, [facility]  [facility] [address] [place], [place] [address] [phone]

## 2017-02-18 ENCOUNTER — Ambulatory Visit (INDEPENDENT_AMBULATORY_CARE_PROVIDER_SITE_OTHER): Payer: BLUE CROSS/BLUE SHIELD | Admitting: Urology

## 2017-02-18 ENCOUNTER — Encounter: Payer: Self-pay | Admitting: Urology

## 2017-02-18 VITALS — BP 121/78 | HR 76 | Ht 68.0 in | Wt 166.6 lb

## 2017-02-18 DIAGNOSIS — N529 Male erectile dysfunction, unspecified: Secondary | ICD-10-CM | POA: Diagnosis not present

## 2017-02-18 DIAGNOSIS — E291 Testicular hypofunction: Secondary | ICD-10-CM

## 2017-02-18 DIAGNOSIS — E349 Endocrine disorder, unspecified: Secondary | ICD-10-CM

## 2017-02-18 DIAGNOSIS — N138 Other obstructive and reflux uropathy: Secondary | ICD-10-CM | POA: Diagnosis not present

## 2017-02-18 DIAGNOSIS — N401 Enlarged prostate with lower urinary tract symptoms: Secondary | ICD-10-CM

## 2017-02-18 MED ORDER — CLOMIPHENE CITRATE 50 MG PO TABS: ORAL_TABLET | ORAL | 6 refills | Status: DC

## 2017-08-18 ENCOUNTER — Other Ambulatory Visit: Payer: Self-pay

## 2017-08-18 ENCOUNTER — Other Ambulatory Visit: Payer: BLUE CROSS/BLUE SHIELD

## 2017-08-18 DIAGNOSIS — E291 Testicular hypofunction: Secondary | ICD-10-CM

## 2017-08-18 NOTE — Progress Notes (Signed)
9:25 AM   COLIE FUGITT [birth year] [id number]  Referring provider: Idelle Crouch, MD [facility] [place], [place] [address]  Chief Complaint  Patient presents with  . Benign Prostatic Hypertrophy  . Other    testosterone deficiency     HPI: Patient is a 46 year old Caucasian male with testosterone deficiency, erectile dysfunction and BPH with LUTS who presents today for 6 month follow-up.  Testosterone deficiency Patient is not experiencing Symptoms of testosterone deficiency at this time.   This is indicated by his responses to the ADAM questionnaire.  He is still having spontaneous erections at night.  He does not have sleep apnea.   His current testosterone level was 522 ng/dL on 08/18/2017.  LFT's, HBG and HCT are normal.  He is currently managing his hypogonadism with Clomid 50 mg, 1/2 daily.        Androgen Deficiency in the Aging Male    [place] Name 08/20/17 0900         Androgen Deficiency in the Aging Male   Do you have a decrease in libido (sex drive) No     Do you have lack of energy No     Do you have a decrease in strength and/or endurance No     Have you lost height No     Have you noticed a decreased "enjoyment of life" No     Are you sad and/or grumpy No     Are your erections less strong No     Have you noticed a recent deterioration in your ability to play sports No     Are you falling asleep after dinner No     Has there been a recent deterioration in your work performance No        Erectile dysfunction His SHIM score is 22, which is no ED.   His previous SHIM was 20.  He has been having difficulty with erections for three years.   His major complaint is achieving erections.  His libido is preserved.   His risk factors for ED are former smoker, age, BPH, testosterone deficiency, hyperlipidemia and HTN.  He denies any painful erections or curvatures with his erections.         SHIM    [place] Name 08/20/17 0925         SHIM: Over the last 6 months:   How do you rate your confidence that you could get and keep an erection? High     When you had erections with sexual stimulation, how often were your erections hard enough for penetration (entering your partner)? Most Times (much more than half the time)     During sexual intercourse, how often were you able to maintain your erection after you had penetrated (entered) your partner? Most Times (much more than half the time)     During sexual intercourse, how difficult was it to maintain your erection to completion of intercourse? Not Difficult     When you attempted sexual intercourse, how often was it satisfactory for you? Almost Always or Always       SHIM Total Score   SHIM 22        Score: 1-7 Severe ED 8-11 Moderate ED 12-16 Mild-Moderate ED 17-21 Mild ED 22-25 No ED   BPH WITH LUTS His IPSS score today is 3, which is mild lower urinary tract symptomatology. He is pleased with his quality life due to his urinary symptoms. His previous IPSS  score was 4/1.  His major complaint today nocturia x 2.  He has had these symptoms for as long as he can remember.  He denies any dysuria, hematuria or suprapubic pain.  He also denies any recent fevers, chills, nausea or vomiting.  He does not have a family history of PCa.      IPSS    Row Name 08/20/17 0900         International Prostate Symptom Score   How often have you had the sensation of not emptying your bladder? Not at All     How often have you had to urinate less than every two hours? Less than 1 in 5 times     How often have you found you stopped and started again several times when you urinated? Not at All     How often have you found it difficult to postpone urination? Not at All     How often have you had a weak urinary stream? Not at All     How often have you had to strain to start urination? Not at All     How many times did you typically get up at night to urinate? 2 Times     Total IPSS  Score 3       Quality of Life due to urinary symptoms   If you were to spend the rest of your life with your urinary condition just the way it is now how would you feel about that? Pleased        Score:  1-7 Mild 8-19 Moderate 20-35 Severe   PMH: Past Medical History:  Diagnosis Date  . BPH with obstruction/lower urinary tract symptoms   . HLD (hyperlipidemia)   . HTN (hypertension)   . Hypogonadism in male     Surgical History: Past Surgical History:  Procedure Laterality Date  . FOOT FRACTURE SURGERY Bilateral     Home Medications:  Allergies as of 08/20/2017   No Known Allergies     Medication List       Accurate as of 08/20/17  9:25 AM. Always use your most recent med list.          clomiPHENE 50 MG tablet Commonly known as:  CLOMID 1/2 tab daily   losartan-hydrochlorothiazide 100-12.5 MG tablet Commonly known as:  HYZAAR Take 1 tablet by mouth daily.   rosuvastatin 40 MG tablet Commonly known as:  CRESTOR Take 40 mg by mouth daily.       Allergies: No Known Allergies  Family History: Family History  Problem Relation Age of Onset  . Stroke Father   . Breast cancer Mother   . Kidney disease Neg Hx   . Prostate cancer Neg Hx     Social History:  reports that he has quit smoking. He has quit using smokeless tobacco. He reports that he drinks alcohol. He reports that he does not use drugs.  ROS: UROLOGY Frequent Urination?: No Hard to postpone urination?: No Burning/pain with urination?: No Get up at night to urinate?: No Leakage of urine?: No Urine stream starts and stops?: No Trouble starting stream?: No Do you have to strain to urinate?: No Blood in urine?: No Urinary tract infection?: No Sexually transmitted disease?: No Injury to kidneys or bladder?: No Painful intercourse?: No Weak stream?: No Erection problems?: No Penile pain?: No  Gastrointestinal Nausea?: No Vomiting?: No Indigestion/heartburn?: No Diarrhea?:  No Constipation?: No  Constitutional Fever: No Night sweats?: No Weight loss?: No Fatigue?: No  Skin Skin rash/lesions?: No Itching?: No  Eyes Blurred vision?: No Double vision?: No  Ears/Nose/Throat Sore throat?: No Sinus problems?: No  Hematologic/Lymphatic Swollen glands?: No Easy bruising?: No  Cardiovascular Leg swelling?: No Chest pain?: No  Respiratory Cough?: No Shortness of breath?: No  Endocrine Excessive thirst?: No  Musculoskeletal Back pain?: No Joint pain?: No  Neurological Headaches?: No Dizziness?: No  Psychologic Depression?: No Anxiety?: No  Physical Exam: BP (!) 146/88   Pulse 76   Ht 5\' 8"  (1.727 m)   Wt 164 lb (74.4 kg)   BMI 24.94 kg/m   GU: Patient with circumcised phallus.  Urethral meatus is patent.  No penile discharge. No penile lesions or rashes. Scrotum without lesions, cysts, rashes and/or edema.  Testicles are located scrotally bilaterally. No masses are appreciated in the testicles. Left and right epididymis are normal. Rectal: Patient with  normal sphincter tone. Perineum without scarring or rashes. No rectal masses are appreciated. Prostate is approximately 45 grams,  nodules are appreciated. Seminal vesicles are normal.  Laboratory Data: PSA History:  1.0 ng/mL on 08/03/2013  1.2 ng/mL on 02/01/2014  1.4 ng/mL on 08/03/2014  1.4 ng/mL on 02/02/2015  1.3 ng/mL on 08/21/2015  1.7 ng/mL on 02/19/2016  1.5 ng/mL on 08/18/2016  1.8 ng/mL on 02/11/2017  1.9 ng/mL on 08/18/2017    Assessment & Plan:    1. Testosterone deficiency:     -most recent testosterone level is 522 ng/dL on 08/18/2017; LFT's, HBG and HCT normal  -continue Clomid 50 mg, 1/2 tablet daily-refills given today  -RTC in 6 months for HCT, HBG, testosterone, PSA, LFT's, ADAM and exam  2. BPH with LUTS  - IPSS score is 4/1, it is improving  - Continue conservative management, avoiding bladder irritants and timed voiding's  - PSA 1.9 and  stable  - RTC in 6 months for IPSS, PSA and exam, as testosterone therapy can cause prostate enlargement and worsen LUTS  3. Erectile dysfunction:     -SHIM score is 20, it is worsening  -RTC in 6 months for SHIM score and exam, as testosterone therapy can affect erections  Return in about 1 year (around 08/20/2018) for PSA, LFT's, HCT, HBG, testosterone (before 10 AM), ADAM, IPSS, SHIM and exam.  Zara Council, [facility]  [facility] [address] [place], [place] [address] [phone]

## 2017-08-19 LAB — PSA: PROSTATE SPECIFIC AG, SERUM: 1.9 ng/mL (ref 0.0–4.0)

## 2017-08-19 LAB — TESTOSTERONE: Testosterone: 522 ng/dL (ref 264–916)

## 2017-08-19 LAB — HEPATIC FUNCTION PANEL
ALBUMIN: 4.3 g/dL (ref 3.5–5.5)
ALK PHOS: 46 IU/L (ref 39–117)
ALT: 20 IU/L (ref 0–44)
AST: 18 IU/L (ref 0–40)
Bilirubin Total: 0.5 mg/dL (ref 0.0–1.2)
Bilirubin, Direct: 0.12 mg/dL (ref 0.00–0.40)
TOTAL PROTEIN: 6.8 g/dL (ref 6.0–8.5)

## 2017-08-19 LAB — HEMATOCRIT: Hematocrit: 42.5 % (ref 37.5–51.0)

## 2017-08-19 LAB — HEMOGLOBIN: Hemoglobin: 14.4 g/dL (ref 13.0–17.7)

## 2017-08-20 ENCOUNTER — Ambulatory Visit (INDEPENDENT_AMBULATORY_CARE_PROVIDER_SITE_OTHER): Payer: BLUE CROSS/BLUE SHIELD | Admitting: Urology

## 2017-08-20 ENCOUNTER — Encounter: Payer: Self-pay | Admitting: Urology

## 2017-08-20 VITALS — BP 146/88 | HR 76 | Ht 68.0 in | Wt 164.0 lb

## 2017-08-20 DIAGNOSIS — N529 Male erectile dysfunction, unspecified: Secondary | ICD-10-CM | POA: Diagnosis not present

## 2017-08-20 DIAGNOSIS — N138 Other obstructive and reflux uropathy: Secondary | ICD-10-CM | POA: Diagnosis not present

## 2017-08-20 DIAGNOSIS — E291 Testicular hypofunction: Secondary | ICD-10-CM

## 2017-08-20 DIAGNOSIS — E349 Endocrine disorder, unspecified: Secondary | ICD-10-CM | POA: Diagnosis not present

## 2017-08-20 DIAGNOSIS — N401 Enlarged prostate with lower urinary tract symptoms: Secondary | ICD-10-CM | POA: Diagnosis not present

## 2017-08-20 MED ORDER — CLOMIPHENE CITRATE 50 MG PO TABS: ORAL_TABLET | ORAL | 6 refills | Status: DC

## 2017-08-20 NOTE — Addendum Note (Signed)
Addended by: Toniann Fail C on: 08/20/2017 11:18 AM   Modules accepted: Orders

## 2018-02-18 ENCOUNTER — Telehealth: Payer: Self-pay | Admitting: Urology

## 2018-02-18 NOTE — Telephone Encounter (Signed)
Pt called office inquiring that his Clomid will not be available until September, pt would like to know if there is a substitute available until clomid is available. Please advise. Thanks.

## 2018-02-18 NOTE — Telephone Encounter (Signed)
Unfortunately, there is no substitute for Clomid (clomiphene).  At this time, they can choose to forego testosterone therapy until the Clomid is available or change their treatment to a exogenous testosterone product.  This is where the testosterone treatment becomes mostly insurance driven as they will need to contact their insurance company to see what testosterone products are covered through their insurance carrier.      Once that is determined, we can prescribe the product that is covered through their insurance or if the insurance covers multiple products we can have further discussion on which one they would like to try first.

## 2018-02-19 NOTE — Telephone Encounter (Signed)
Spoke with pt in reference to clomid shortage. Pt stated that he would prefer to wait until Sept when clomid is available again.

## 2018-08-10 ENCOUNTER — Other Ambulatory Visit: Payer: BLUE CROSS/BLUE SHIELD

## 2018-08-10 DIAGNOSIS — N138 Other obstructive and reflux uropathy: Secondary | ICD-10-CM

## 2018-08-10 DIAGNOSIS — N401 Enlarged prostate with lower urinary tract symptoms: Principal | ICD-10-CM

## 2018-08-10 DIAGNOSIS — E349 Endocrine disorder, unspecified: Secondary | ICD-10-CM

## 2018-08-11 LAB — HEMOGLOBIN: Hemoglobin: 14.8 g/dL (ref 13.0–17.7)

## 2018-08-11 LAB — HEPATIC FUNCTION PANEL
ALBUMIN: 4.5 g/dL (ref 3.5–5.5)
ALT: 23 IU/L (ref 0–44)
AST: 18 IU/L (ref 0–40)
Alkaline Phosphatase: 49 IU/L (ref 39–117)
BILIRUBIN TOTAL: 0.5 mg/dL (ref 0.0–1.2)
Bilirubin, Direct: 0.12 mg/dL (ref 0.00–0.40)
TOTAL PROTEIN: 7 g/dL (ref 6.0–8.5)

## 2018-08-11 LAB — PSA: PROSTATE SPECIFIC AG, SERUM: 2.2 ng/mL (ref 0.0–4.0)

## 2018-08-11 LAB — TESTOSTERONE: Testosterone: 226 ng/dL — ABNORMAL LOW (ref 264–916)

## 2018-08-11 LAB — HEMATOCRIT: Hematocrit: 43 % (ref 37.5–51.0)

## 2018-08-17 ENCOUNTER — Encounter: Payer: Self-pay | Admitting: Urology

## 2018-08-17 ENCOUNTER — Ambulatory Visit: Payer: BLUE CROSS/BLUE SHIELD | Admitting: Urology

## 2018-08-17 VITALS — BP 137/89 | HR 73 | Ht 68.0 in | Wt 164.9 lb

## 2018-08-17 DIAGNOSIS — E349 Endocrine disorder, unspecified: Secondary | ICD-10-CM

## 2018-08-17 DIAGNOSIS — N138 Other obstructive and reflux uropathy: Secondary | ICD-10-CM

## 2018-08-17 DIAGNOSIS — N529 Male erectile dysfunction, unspecified: Secondary | ICD-10-CM

## 2018-08-17 DIAGNOSIS — N401 Enlarged prostate with lower urinary tract symptoms: Secondary | ICD-10-CM

## 2018-08-17 MED ORDER — CLOMIPHENE CITRATE 50 MG PO TABS: 50.0000 mg | ORAL_TABLET | Freq: Every day | ORAL | 3 refills | Status: DC

## 2018-08-17 MED ORDER — SILDENAFIL CITRATE 20 MG PO TABS: ORAL_TABLET | ORAL | 3 refills | Status: DC

## 2018-08-17 NOTE — Progress Notes (Signed)
10:14 AM   Benjamin Mitchell [birth year] [id number]  Referring provider: Idelle Crouch, MD [place] [facility] [place], [place] [address]  Chief Complaint  Patient presents with  . Post-op Follow-up    HPI: Patient is a 47 year old Caucasian male with testosterone deficiency, erectile dysfunction and BPH with LUTS who presents today for 12 month follow-up.  Testosterone deficiency He is not having spontaneous erections at night.  He does not have sleep apnea.   His current testosterone level was 226 ng/dL on 08/20/2018.  LFT's, HBG and HCT are normal.  He is currently managing his testosterone deficiency with Clomid 50 mg, 1/2 daily.     Erectile dysfunction His SHIM score is 18, which is mild ED.   His previous SHIM was 22.  He has been having difficulty with erections for four years.   His major complaint is achieving erections.  His libido is preserved.   His risk factors for ED are former smoker, age, BPH, testosterone deficiency, hyperlipidemia and HTN.  He denies any painful erections or curvatures with his erections.     SHIM    Row Name 08/17/18 0956         SHIM: Over the last 6 months:   How do you rate your confidence that you could get and keep an erection?  Moderate     When you had erections with sexual stimulation, how often were your erections hard enough for penetration (entering your partner)?  Sometimes (about half the time)     During sexual intercourse, how often were you able to maintain your erection after you had penetrated (entered) your partner?  Most Times (much more than half the time)     During sexual intercourse, how difficult was it to maintain your erection to completion of intercourse?  Slightly Difficult     When you attempted sexual intercourse, how often was it satisfactory for you?  Most Times (much more than half the time)       SHIM Total Score   SHIM  18        Score: 1-7 Severe ED 8-11 Moderate ED 12-16  Mild-Moderate ED 17-21 Mild ED 22-25 No ED   BPH WITH LUTS His IPSS score today is 2, which is mild lower urinary tract symptomatology. He is delighted with his quality life due to his urinary symptoms. His previous IPSS score was 3/1.  His major complaint today nocturia x 2.  He has had these symptoms for as long as he can remember.  He denies any dysuria, hematuria or suprapubic pain.  He also denies any recent fevers, chills, nausea or vomiting.  He does not have a family history of PCa.  IPSS    Row Name 08/17/18 0900         International Prostate Symptom Score   How often have you had the sensation of not emptying your bladder?  Not at All     How often have you had to urinate less than every two hours?  Less than 1 in 5 times     How often have you found you stopped and started again several times when you urinated?  Not at All     How often have you found it difficult to postpone urination?  Not at All     How often have you had a weak urinary stream?  Not at All     How often have you had to strain to start urination?  Not at All     How many times did you typically get up at night to urinate?  1 Time     Total IPSS Score  2       Quality of Life due to urinary symptoms   If you were to spend the rest of your life with your urinary condition just the way it is now how would you feel about that?  Delighted        Score:  1-7 Mild 8-19 Moderate 20-35 Severe   PMH: Past Medical History:  Diagnosis Date  . BPH with obstruction/lower urinary tract symptoms   . HLD (hyperlipidemia)   . HTN (hypertension)   . Hypogonadism in male     Surgical History: Past Surgical History:  Procedure Laterality Date  . FOOT FRACTURE SURGERY Bilateral     Home Medications:  Allergies as of 08/17/2018   No Known Allergies     Medication List        Accurate as of 08/17/18 10:14 AM. Always use your most recent med list.          clomiPHENE 50 MG tablet Commonly known as:   CLOMID Take 1 tablet (50 mg total) by mouth daily.   losartan-hydrochlorothiazide 100-12.5 MG tablet Commonly known as:  HYZAAR Take 1 tablet by mouth daily.   rosuvastatin 40 MG tablet Commonly known as:  CRESTOR Take 40 mg by mouth daily.   sildenafil 20 MG tablet Commonly known as:  REVATIO Take 3 to 5 tablets two hours before intercouse on an empty stomach.  Do not take with nitrates.       Allergies: No Known Allergies  Family History: Family History  Problem Relation Age of Onset  . Stroke Father   . Breast cancer Mother   . Kidney disease Neg Hx   . Prostate cancer Neg Hx     Social History:  reports that he has quit smoking. He has quit using smokeless tobacco. He reports that he drinks alcohol. He reports that he does not use drugs.  ROS: UROLOGY Frequent Urination?: No Hard to postpone urination?: No Burning/pain with urination?: No Get up at night to urinate?: No Leakage of urine?: No Urine stream starts and stops?: No Trouble starting stream?: No Do you have to strain to urinate?: No Blood in urine?: No Urinary tract infection?: No Sexually transmitted disease?: No Injury to kidneys or bladder?: No Weak stream?: No Erection problems?: No Penile pain?: No  Gastrointestinal Nausea?: No Vomiting?: No Indigestion/heartburn?: No Constipation?: No  Constitutional Fever: No Night sweats?: No Weight loss?: No Fatigue?: No  Skin Skin rash/lesions?: No Itching?: No  Eyes Blurred vision?: No Double vision?: No  Ears/Nose/Throat Sore throat?: No Sinus problems?: No  Hematologic/Lymphatic Swollen glands?: No  Cardiovascular Leg swelling?: No Chest pain?: No  Respiratory Cough?: No Shortness of breath?: No  Endocrine Excessive thirst?: No  Musculoskeletal Back pain?: No Joint pain?: No  Neurological Headaches?: No Dizziness?: No  Psychologic Depression?: No Anxiety?: No  Physical Exam: BP 137/89 (BP Location: Left Arm,  Patient Position: Sitting, Cuff Size: Normal)   Pulse 73   Ht 5\' 8"  (1.727 m)   Wt 164 lb 14.4 oz (74.8 kg)   BMI 25.07 kg/m   Constitutional: Well nourished. Alert and oriented, No acute distress. HEENT: [place] AT, moist mucus membranes. Trachea midline, no masses. Cardiovascular: No clubbing, cyanosis, or edema. Respiratory: Normal respiratory effort, no increased work of breathing. GI: Abdomen is soft, non tender, non distended,  no abdominal masses. Liver and spleen not palpable.  No hernias appreciated.  Stool sample for occult testing is not indicated.   GU: No CVA tenderness.  No bladder fullness or masses.  Patient with circumcised phallus.   Urethral meatus is patent.  No penile discharge. No penile lesions or rashes. Scrotum without lesions, cysts, rashes and/or edema.  Testicles are located scrotally bilaterally. No masses are appreciated in the testicles. Left and right epididymis are normal. Rectal: Patient with  normal sphincter tone. Anus and perineum without scarring or rashes. No rectal masses are appreciated. Prostate is approximately 45 grams, no nodules are appreciated. Seminal vesicles are normal. Skin: No rashes, bruises or suspicious lesions. Lymph: No cervical or inguinal adenopathy. Neurologic: Grossly intact, no focal deficits, moving all 4 extremities. Psychiatric: Normal mood and affect.  Laboratory Data: PSA History:  1.0 ng/mL on 08/03/2013  1.2 ng/mL on 02/01/2014  1.4 ng/mL on 08/03/2014  1.4 ng/mL on 02/02/2015  1.3 ng/mL on 08/21/2015  1.7 ng/mL on 02/19/2016  1.5 ng/mL on 08/18/2016  1.8 ng/mL on 02/11/2017  1.9 ng/mL on 08/18/2017  2.2 ng/mL on 08/10/2018 I have reviewed the labs.   Assessment & Plan:    1. Testosterone deficiency:     -most recent testosterone level is 226 ng/dL on 08/10/2018; LFT's, HBG and HCT normal  -patient has been without Clomid for several months; restart Clomid 50 mg, 1/2 tablet daily-refills given today  -recheck  testosterone levels in one month   -RTC in 12 months for HCT, HBG, testosterone, PSA, LFT's, ADAM and exam  2. BPH with LUTS  - IPSS score is 2/0, it is improving  - Continue conservative management, avoiding bladder irritants and timed voiding's  - RTC in 12 months for IPSS, PSA and exam, as testosterone therapy can cause prostate enlargement and worsen LUTS  3. Erectile dysfunction:     -SHIM score is 20, it is worsening  -RTC in 12 months for SHIM score and exam, as testosterone therapy can affect erections  Return in about 1 month (around 09/17/2018) for testosterone before 10 am only .  Zara Council, PA-C  [facility] [address] [place], [place] [address] [phone]

## 2018-09-20 ENCOUNTER — Other Ambulatory Visit: Payer: BLUE CROSS/BLUE SHIELD

## 2018-09-20 ENCOUNTER — Other Ambulatory Visit: Payer: Self-pay

## 2018-09-20 DIAGNOSIS — E349 Endocrine disorder, unspecified: Secondary | ICD-10-CM

## 2018-09-21 LAB — TESTOSTERONE: Testosterone: 502 ng/dL (ref 264–916)

## 2019-02-11 ENCOUNTER — Other Ambulatory Visit: Payer: Self-pay | Admitting: Urology

## 2019-03-18 ENCOUNTER — Other Ambulatory Visit: Payer: BLUE CROSS/BLUE SHIELD

## 2019-03-22 ENCOUNTER — Other Ambulatory Visit: Payer: Self-pay | Admitting: Family Medicine

## 2019-03-22 DIAGNOSIS — N138 Other obstructive and reflux uropathy: Secondary | ICD-10-CM

## 2019-03-22 DIAGNOSIS — E349 Endocrine disorder, unspecified: Secondary | ICD-10-CM

## 2019-03-23 ENCOUNTER — Other Ambulatory Visit: Payer: BLUE CROSS/BLUE SHIELD

## 2019-03-23 ENCOUNTER — Ambulatory Visit: Payer: BLUE CROSS/BLUE SHIELD | Admitting: Urology

## 2019-03-23 ENCOUNTER — Other Ambulatory Visit: Payer: Self-pay

## 2019-03-23 DIAGNOSIS — N138 Other obstructive and reflux uropathy: Secondary | ICD-10-CM

## 2019-03-23 DIAGNOSIS — E349 Endocrine disorder, unspecified: Secondary | ICD-10-CM

## 2019-03-23 DIAGNOSIS — N401 Enlarged prostate with lower urinary tract symptoms: Secondary | ICD-10-CM

## 2019-03-24 LAB — HEPATIC FUNCTION PANEL
ALT: 24 IU/L (ref 0–44)
AST: 22 IU/L (ref 0–40)
Albumin: 4.6 g/dL (ref 4.0–5.0)
Alkaline Phosphatase: 41 IU/L (ref 39–117)
Bilirubin Total: 0.6 mg/dL (ref 0.0–1.2)
Bilirubin, Direct: 0.14 mg/dL (ref 0.00–0.40)
Total Protein: 6.7 g/dL (ref 6.0–8.5)

## 2019-03-24 LAB — TESTOSTERONE: Testosterone: 634 ng/dL (ref 264–916)

## 2019-03-24 LAB — HEMOGLOBIN: Hemoglobin: 14.4 g/dL (ref 13.0–17.7)

## 2019-03-24 LAB — HEMATOCRIT: Hematocrit: 43.5 % (ref 37.5–51.0)

## 2019-03-24 LAB — PSA: Prostate Specific Ag, Serum: 3.3 ng/mL (ref 0.0–4.0)

## 2019-04-03 NOTE — Progress Notes (Signed)
11:03 AM   Benjamin Mitchell Oct 27, 1971 [id number]  Referring provider: Idelle Crouch, MD [facility] [place], [place] [address]  Chief Complaint  Patient presents with  . Benign Prostatic Hypertrophy  . Nephrolithiasis    HPI: Patient is a 48 year old Caucasian male with testosterone deficiency, erectile dysfunction and BPH with LUTS who presents today for 6 month follow-up.  Testosterone deficiency He is not having spontaneous erections at night.  He does not have sleep apnea.   His current testosterone level was 634 ng/dL on 03/23/2019.  LFT's, Hgb and HCT are normal.  He is currently managing his testosterone deficiency with Clomid 50 mg, 1/2 daily.     Erectile dysfunction His SHIM score is 21, which is mild ED.   His previous SHIM was 18.  He has been having difficulty with erections for four years.   His major complaint is achieving erections.  His libido is preserved.   His risk factors for ED are former smoker, age, BPH, testosterone deficiency, hyperlipidemia and HTN.  He denies any painful erections or curvatures with his erections.     SHIM    Row Name 04/04/19 1033         SHIM: Over the last 6 months:   How do you rate your confidence that you could get and keep an erection?  Moderate     When you had erections with sexual stimulation, how often were your erections hard enough for penetration (entering your partner)?  Most Times (much more than half the time)     During sexual intercourse, how often were you able to maintain your erection after you had penetrated (entered) your partner?  Most Times (much more than half the time)     During sexual intercourse, how difficult was it to maintain your erection to completion of intercourse?  Not Difficult     When you attempted sexual intercourse, how often was it satisfactory for you?  Almost Always or Always       SHIM Total Score   SHIM  21        Score: 1-7 Severe ED 8-11 Moderate  ED 12-16 Mild-Moderate ED 17-21 Mild ED 22-25 No ED   BPH WITH LUTS His IPSS score today is 2, which is mild lower urinary tract symptomatology. He is pleased with his quality life due to his urinary symptoms. His previous IPSS score was 2/0.  His major complaint today nocturia x 2.  He has had these symptoms for as long as he can remember.  He denies any dysuria, hematuria or suprapubic pain.  He also denies any recent fevers, chills, nausea or vomiting.  He does not have a family history of PCa. IPSS    Row Name 04/04/19 1000         International Prostate Symptom Score   How often have you had the sensation of not emptying your bladder?  Not at All     How often have you had to urinate less than every two hours?  Not at All     How often have you found you stopped and started again several times when you urinated?  Not at All     How often have you found it difficult to postpone urination?  Not at All     How often have you had a weak urinary stream?  Not at All     How often have you had to strain to start urination?  Not at All     How many times did you typically get up at night to urinate?  2 Times     Total IPSS Score  2       Quality of Life due to urinary symptoms   If you were to spend the rest of your life with your urinary condition just the way it is now how would you feel about that?  Pleased        Score:  1-7 Mild 8-19 Moderate 20-35 Severe   PMH: Past Medical History:  Diagnosis Date  . BPH with obstruction/lower urinary tract symptoms   . HLD (hyperlipidemia)   . HTN (hypertension)   . Hypogonadism in male     Surgical History: Past Surgical History:  Procedure Laterality Date  . FOOT FRACTURE SURGERY Bilateral     Home Medications:  Allergies as of 04/04/2019   No Known Allergies     Medication List       Accurate as of April 04, 2019 11:03 AM. If you have any questions, ask your nurse or doctor.        clomiPHENE 50 MG tablet Commonly  known as:  CLOMID TAKE 1 TABLET(50 MG) BY MOUTH DAILY   losartan-hydrochlorothiazide 100-12.5 MG tablet Commonly known as:  HYZAAR Take 1 tablet by mouth daily.   rosuvastatin 40 MG tablet Commonly known as:  CRESTOR Take 40 mg by mouth daily.   sildenafil 20 MG tablet Commonly known as:  REVATIO Take 3 to 5 tablets two hours before intercouse on an empty stomach.  Do not take with nitrates.       Allergies: No Known Allergies  Family History: Family History  Problem Relation Age of Onset  . Stroke Father   . Breast cancer Mother   . Kidney disease Neg Hx   . Prostate cancer Neg Hx     Social History:  reports that he has quit smoking. He has quit using smokeless tobacco. He reports current alcohol use. He reports that he does not use drugs.  ROS: UROLOGY Frequent Urination?: No Hard to postpone urination?: No Burning/pain with urination?: No Get up at night to urinate?: No Leakage of urine?: No Urine stream starts and stops?: No Trouble starting stream?: No Do you have to strain to urinate?: No Blood in urine?: No Urinary tract infection?: No Sexually transmitted disease?: No Injury to kidneys or bladder?: No Painful intercourse?: No Weak stream?: No Erection problems?: No Penile pain?: No  Gastrointestinal Nausea?: No Vomiting?: No Indigestion/heartburn?: No Diarrhea?: No Constipation?: No  Constitutional Fever: No Night sweats?: No Weight loss?: No Fatigue?: No  Skin Skin rash/lesions?: No Itching?: No  Eyes Blurred vision?: No Double vision?: No  Ears/Nose/Throat Sore throat?: No Sinus problems?: No  Hematologic/Lymphatic Swollen glands?: No Easy bruising?: No  Cardiovascular Leg swelling?: No Chest pain?: No  Respiratory Cough?: No Shortness of breath?: No  Endocrine Excessive thirst?: No  Musculoskeletal Back pain?: No Joint pain?: No  Neurological Headaches?: No Dizziness?: No  Psychologic Depression?: No  Anxiety?: No  Physical Exam: BP (!) 163/88 (BP Location: Left Arm, Patient Position: Sitting, Cuff Size: Normal)   Pulse 71   Ht 5\' 8"  (1.727 m)   Wt 165 lb 1.6 oz (74.9 kg)   BMI 25.10 kg/m   Constitutional:  Well nourished. Alert and oriented, No acute distress. HEENT:  AT, moist mucus membranes.  Trachea midline, no masses. Cardiovascular: No clubbing, cyanosis, or edema. Respiratory: Normal respiratory effort, no increased work  of breathing. GI: Abdomen is soft, non tender, non distended, no abdominal masses. Liver and spleen not palpable.  No hernias appreciated.  Stool sample for occult testing is not indicated.   GU: No CVA tenderness.  No bladder fullness or masses.  Patient with circumcised phallus.  Urethral meatus is patent.  No penile discharge. No penile lesions or rashes. Scrotum without lesions, cysts, rashes and/or edema.  Testicles are located scrotally bilaterally. No masses are appreciated in the testicles. Left and right epididymis are normal. Rectal: Patient with  normal sphincter tone. Anus and perineum without scarring or rashes. No rectal masses are appreciated. Prostate is approximately 55 grams, could only palpate apex and midportion of the gland, no nodules are appreciated.  Skin: No rashes, bruises or suspicious lesions. Lymph: No cervical or inguinal adenopathy. Neurologic: Grossly intact, no focal deficits, moving all 4 extremities. Psychiatric: Normal mood and affect.  Laboratory Data: PSA History:  1.0 ng/mL on 08/03/2013  1.2 ng/mL on 02/01/2014  1.4 ng/mL on 08/03/2014  1.4 ng/mL on 02/02/2015  1.3 ng/mL on 08/21/2015  1.7 ng/mL on 02/19/2016  1.5 ng/mL on 08/18/2016  1.8 ng/mL on 02/11/2017  1.9 ng/mL on 08/18/2017  2.2 ng/mL on 08/10/2018  3.3 ng/mL on 03/23/2019 I have reviewed the labs.   Assessment & Plan:    1. PSA elevation  - explained to Mr. Barrette that the increase in his PSA is concerning for a possible prostate cancer   - will  repeat the PSA today - if returns elevated we discussed undergoing a MRI of the prostate or a biopsy of the prostate - explained that the biopsy is the only test of the two that would give actual tissue samples   - explained how a prostate biopsy is performed and the risks involved, such as blood in urine, blood in stool, blood in semen, infection, urinary retention, and on rare occasions sepsis and death.    - decision pending repeated PSA results   2. Testosterone deficiency:     -most recent testosterone level is 634 ng/dL on 03/23/2019; LFT's, HBG and HCT normal  -hold Clomid 50 mg, 1/2 tablet daily at this time pending PSA results   -if PSA returns to baseline, RTC in 6 months for HCT, HBG, testosterone, PSA, LFT's, ADAM and exam  3. BPH with LUTS  - IPSS score is 2/1, it is slightly worse  - Continue conservative management, avoiding bladder irritants and timed voiding's  - RTC in 12 months for IPSS, PSA and exam, as testosterone therapy can cause prostate enlargement and worsen LUTS  4. Erectile dysfunction:     -SHIM score is 21, it is improved   -RTC in 12 months for SHIM score and exam, as testosterone therapy can affect erections  Return in about 6 months (around 10/04/2019) for IPSS, SHIM and exam, PSA, LFT's, HCT, HBG, testosterone (before 10 AM).  Zara Council, PA-C  [facility] [address] [place], [place] [address] [phone]

## 2019-04-04 ENCOUNTER — Encounter: Payer: Self-pay | Admitting: Urology

## 2019-04-04 ENCOUNTER — Other Ambulatory Visit: Payer: Self-pay

## 2019-04-04 ENCOUNTER — Ambulatory Visit: Payer: BC Managed Care – PPO | Admitting: Urology

## 2019-04-04 VITALS — BP 163/88 | HR 71 | Ht 68.0 in | Wt 165.1 lb

## 2019-04-04 DIAGNOSIS — N529 Male erectile dysfunction, unspecified: Secondary | ICD-10-CM

## 2019-04-04 DIAGNOSIS — E349 Endocrine disorder, unspecified: Secondary | ICD-10-CM | POA: Diagnosis not present

## 2019-04-04 DIAGNOSIS — N138 Other obstructive and reflux uropathy: Secondary | ICD-10-CM

## 2019-04-04 DIAGNOSIS — R972 Elevated prostate specific antigen [PSA]: Secondary | ICD-10-CM | POA: Diagnosis not present

## 2019-04-04 DIAGNOSIS — N401 Enlarged prostate with lower urinary tract symptoms: Secondary | ICD-10-CM

## 2019-04-05 LAB — PSA: Prostate Specific Ag, Serum: 3 ng/mL (ref 0.0–4.0)

## 2019-07-07 ENCOUNTER — Other Ambulatory Visit: Payer: Self-pay | Admitting: *Deleted

## 2019-07-07 DIAGNOSIS — R972 Elevated prostate specific antigen [PSA]: Secondary | ICD-10-CM

## 2019-07-08 ENCOUNTER — Other Ambulatory Visit: Payer: BC Managed Care – PPO

## 2019-07-08 ENCOUNTER — Other Ambulatory Visit: Payer: Self-pay

## 2019-07-08 DIAGNOSIS — R972 Elevated prostate specific antigen [PSA]: Secondary | ICD-10-CM

## 2019-07-09 LAB — PSA: Prostate Specific Ag, Serum: 2 ng/mL (ref 0.0–4.0)

## 2019-09-27 ENCOUNTER — Other Ambulatory Visit: Payer: Self-pay | Admitting: Family Medicine

## 2019-09-27 DIAGNOSIS — E349 Endocrine disorder, unspecified: Secondary | ICD-10-CM

## 2019-09-27 DIAGNOSIS — N138 Other obstructive and reflux uropathy: Secondary | ICD-10-CM

## 2019-09-27 DIAGNOSIS — R972 Elevated prostate specific antigen [PSA]: Secondary | ICD-10-CM

## 2019-10-03 ENCOUNTER — Other Ambulatory Visit: Payer: BC Managed Care – PPO

## 2019-10-03 ENCOUNTER — Other Ambulatory Visit: Payer: Self-pay

## 2019-10-03 DIAGNOSIS — E349 Endocrine disorder, unspecified: Secondary | ICD-10-CM

## 2019-10-03 DIAGNOSIS — R972 Elevated prostate specific antigen [PSA]: Secondary | ICD-10-CM

## 2019-10-03 DIAGNOSIS — N138 Other obstructive and reflux uropathy: Secondary | ICD-10-CM

## 2019-10-04 LAB — HEPATIC FUNCTION PANEL
ALT: 22 IU/L (ref 0–44)
AST: 23 IU/L (ref 0–40)
Albumin: 4.4 g/dL (ref 4.0–5.0)
Alkaline Phosphatase: 41 IU/L (ref 39–117)
Bilirubin Total: 0.4 mg/dL (ref 0.0–1.2)
Bilirubin, Direct: 0.11 mg/dL (ref 0.00–0.40)
Total Protein: 6.6 g/dL (ref 6.0–8.5)

## 2019-10-04 LAB — HEMATOCRIT: Hematocrit: 42.6 % (ref 37.5–51.0)

## 2019-10-04 LAB — HEMOGLOBIN: Hemoglobin: 14.2 g/dL (ref 13.0–17.7)

## 2019-10-04 LAB — PSA: Prostate Specific Ag, Serum: 3 ng/mL (ref 0.0–4.0)

## 2019-10-04 LAB — TESTOSTERONE: Testosterone: 484 ng/dL (ref 264–916)

## 2019-10-07 ENCOUNTER — Ambulatory Visit: Payer: BC Managed Care – PPO | Admitting: Urology

## 2019-10-10 NOTE — Progress Notes (Deleted)
9:27 PM   Benjamin Mitchell Sep 21, 1971 [id number]  Referring provider: Idelle Crouch, MD [facility] [place],  [place] [address]  No chief complaint on file.   HPI: Patient is a 48 year old male with testosterone deficiency, erectile dysfunction and BPH with LUTS who presents today for 6 month follow-up.  Testosterone deficiency He is not having spontaneous erections at night.  He does not have sleep apnea.   His current testosterone level was 484 ng/dL on 09/2019.  LFT's, Hgb and HCT are normal.  He is currently managing his testosterone deficiency with Clomid 50 mg, 1/2 daily.     Erectile dysfunction His SHIM score is ***, which is *** ED.   His previous SHIM was 21.  He has been having difficulty with erections for four years.   His major complaint is achieving erections.  His libido is preserved.   His risk factors for ED are former smoker, age, BPH, testosterone deficiency, hyperlipidemia and HTN.  He denies any painful erections or curvatures with his erections.       Score: 1-7 Severe ED 8-11 Moderate ED 12-16 Mild-Moderate ED 17-21 Mild ED 22-25 No ED   BPH WITH LUTS His IPSS score today is ***, which is *** lower urinary tract symptomatology. He is *** with his quality life due to his urinary symptoms. His previous IPSS score was 2/1.  His major complaint today nocturia x 2.  He has had these symptoms for as long as he can remember.  He denies any dysuria, hematuria or suprapubic pain.  He also denies any recent fevers, chills, nausea or vomiting.  He does not have a family history of PCa.   Score:  1-7 Mild 8-19 Moderate 20-35 Severe   PMH: Past Medical History:  Diagnosis Date  . BPH with obstruction/lower urinary tract symptoms   . HLD (hyperlipidemia)   . HTN (hypertension)   . Hypogonadism in male     Surgical History: Past Surgical History:  Procedure Laterality Date  . FOOT FRACTURE SURGERY Bilateral     Home  Medications:  Allergies as of 10/11/2019   No Known Allergies     Medication List       Accurate as of October 10, 2019  9:27 PM. If you have any questions, ask your nurse or doctor.        clomiPHENE 50 MG tablet Commonly known as: CLOMID TAKE 1 TABLET(50 MG) BY MOUTH DAILY   losartan-hydrochlorothiazide 100-12.5 MG tablet Commonly known as: HYZAAR Take 1 tablet by mouth daily.   rosuvastatin 40 MG tablet Commonly known as: CRESTOR Take 40 mg by mouth daily.   sildenafil 20 MG tablet Commonly known as: REVATIO Take 3 to 5 tablets two hours before intercouse on an empty stomach.  Do not take with nitrates.       Allergies: No Known Allergies  Family History: Family History  Problem Relation Age of Onset  . Stroke Father   . Breast cancer Mother   . Kidney disease Neg Hx   . Prostate cancer Neg Hx     Social History:  reports that he has quit smoking. He has quit using smokeless tobacco. He reports current alcohol use. He reports that he does not use drugs.  ROS:  Physical Exam: There were no vitals taken for this visit.  Constitutional:  Well nourished. Alert and oriented, No acute distress. HEENT: [place] AT, moist mucus membranes.  Trachea midline, no masses. Cardiovascular: No clubbing, cyanosis, or edema. Respiratory: Normal respiratory effort, no increased work of breathing. GI: Abdomen is soft, non tender, non distended, no abdominal masses. Liver and spleen not palpable.  No hernias appreciated.  Stool sample for occult testing is not indicated.   GU: No CVA tenderness.  No bladder fullness or masses.  Patient with circumcised/uncircumcised phallus. ***Foreskin easily retracted***  Urethral meatus is patent.  No penile discharge. No penile lesions or rashes. Scrotum without lesions, cysts, rashes and/or edema.  Testicles are located scrotally bilaterally. No masses are appreciated in the testicles.  Left and right epididymis are normal. Rectal: Patient with  normal sphincter tone. Anus and perineum without scarring or rashes. No rectal masses are appreciated. Prostate is approximately *** grams, *** nodules are appreciated. Seminal vesicles are normal. Skin: No rashes, bruises or suspicious lesions. Lymph: No cervical or inguinal adenopathy. Neurologic: Grossly intact, no focal deficits, moving all 4 extremities. Psychiatric: Normal mood and affect.  Laboratory Data: PSA History:  1.0 ng/mL on 08/03/2013  1.2 ng/mL on 02/01/2014  1.4 ng/mL on 08/03/2014  1.4 ng/mL on 02/02/2015   Component     Latest Ref Rng & Units 08/21/2015 02/19/2016 08/18/2016 02/11/2017  Prostate Specific Ag, Serum     0.0 - 4.0 ng/mL 1.3 1.7 1.5 1.8   Component     Latest Ref Rng & Units 08/18/2017 08/10/2018 03/23/2019 04/04/2019  Prostate Specific Ag, Serum     0.0 - 4.0 ng/mL 1.9 2.2 3.3 3.0   Component     Latest Ref Rng & Units 07/08/2019 10/03/2019  Prostate Specific Ag, Serum     0.0 - 4.0 ng/mL 2.0 3.0   I have reviewed the labs.   Assessment & Plan:    1. PSA elevation  - explained to Benjamin Mitchell that the increase in his PSA is concerning for a possible prostate cancer   - will repeat the PSA today - if returns elevated we discussed undergoing a MRI of the prostate or a biopsy of the prostate - explained that the biopsy is the only test of the two that would give actual tissue samples   - explained how a prostate biopsy is performed and the risks involved, such as blood in urine, blood in stool, blood in semen, infection, urinary retention, and on rare occasions sepsis and death.    - decision pending repeated PSA results   2. Testosterone deficiency:     -most recent testosterone level is 484 ng/dL on 09/2019; LFT's, HBG and HCT normal  -hold Clomid 50 mg, 1/2 tablet daily at this time pending PSA results   -if PSA returns to baseline, RTC in 6 months for HCT, HBG, testosterone, PSA, LFT's,  ADAM and exam  3. BPH with LUTS  - IPSS score is 2/1, it is slightly worse  - Continue conservative management, avoiding bladder irritants and timed voiding's  - RTC in 12 months for IPSS, PSA and exam, as testosterone therapy can cause prostate enlargement and worsen LUTS  4. Erectile dysfunction:     -SHIM score is 21, it is improved   -RTC in 12 months for SHIM score and exam, as testosterone therapy can affect erections  No follow-ups on file.  Zara Council, [facility] [address] [place], [place]  [address] [phone]   

## 2019-10-11 ENCOUNTER — Ambulatory Visit: Payer: BC Managed Care – PPO | Admitting: Urology

## 2019-10-11 DIAGNOSIS — N138 Other obstructive and reflux uropathy: Secondary | ICD-10-CM

## 2019-10-11 DIAGNOSIS — E349 Endocrine disorder, unspecified: Secondary | ICD-10-CM

## 2019-10-11 DIAGNOSIS — N529 Male erectile dysfunction, unspecified: Secondary | ICD-10-CM

## 2019-10-11 DIAGNOSIS — R972 Elevated prostate specific antigen [PSA]: Secondary | ICD-10-CM

## 2019-10-31 NOTE — Progress Notes (Signed)
8:54 AM   Benjamin Mitchell 10-Dec-1970 [id number]  Referring provider: Idelle Crouch, MD [facility] [place],  [place] [address]  Chief Complaint  Patient presents with  . Elevated PSA    6 month F/U    HPI: Patient is a 49 year old male with testosterone deficiency, erectile dysfunction and BPH with LUTS who presents today for 6 month follow-up.  Testosterone deficiency He is not having spontaneous erections at night.  He does not have sleep apnea.   His current testosterone level was 484 ng/dL on 09/2019.  LFT's, Hgb and HCT are normal.  He is currently managing his testosterone deficiency with Clomid 50 mg, 1/2 daily.     Erectile dysfunction His SHIM score is 22, which is no ED.   His previous SHIM was 21.  He has been having difficulty with erections for four years.   His major complaint is achieving erections.  His libido is preserved.   His risk factors for ED are former smoker, age, BPH, testosterone deficiency, hyperlipidemia and HTN.  He denies any painful erections or curvatures with his erections.    SHIM    Row Name 11/01/19 [phone]         SHIM: Over the last 6 months:   How do you rate your confidence that you could get and keep an erection?  High     When you had erections with sexual stimulation, how often were your erections hard enough for penetration (entering your partner)?  Most Times (much more than half the time)     During sexual intercourse, how often were you able to maintain your erection after you had penetrated (entered) your partner?  Most Times (much more than half the time)     During sexual intercourse, how difficult was it to maintain your erection to completion of intercourse?  Not Difficult     When you attempted sexual intercourse, how often was it satisfactory for you?  Almost Always or Always       SHIM Total Score   SHIM  22        Score: 1-7 Severe ED 8-11 Moderate ED 12-16 Mild-Moderate ED 17-21 Mild  ED 22-25 No ED   BPH WITH LUTS His IPSS score today is 1, which is mild lower urinary tract symptomatology. He is pleased with his quality life due to his urinary symptoms. His previous IPSS score was 2/1.  His major complaint today nocturia x 2.  He has had these symptoms for as long as he can remember.  He denies any dysuria, hematuria or suprapubic pain.  He also denies any recent fevers, chills, nausea or vomiting.  He does not have a family history of PCa. IPSS    Row Name 11/01/19 0800         International Prostate Symptom Score   How often have you had the sensation of not emptying your bladder?  Not at All     How often have you had to urinate less than every two hours?  Not at All     How often have you found you stopped and started again several times when you urinated?  Not at All     How often have you found it difficult to postpone urination?  Not at All     How often have you had a weak urinary stream?  Not at All     How often have you had to strain to start urination?  Not at All     How many times did you typically get up at night to urinate?  1 Time     Total IPSS Score  1       Quality of Life due to urinary symptoms   If you were to spend the rest of your life with your urinary condition just the way it is now how would you feel about that?  Pleased        Score:  1-7 Mild 8-19 Moderate 20-35 Severe   PMH: Past Medical History:  Diagnosis Date  . BPH with obstruction/lower urinary tract symptoms   . HLD (hyperlipidemia)   . HTN (hypertension)   . Hypogonadism in male     Surgical History: Past Surgical History:  Procedure Laterality Date  . FOOT FRACTURE SURGERY Bilateral     Home Medications:  Allergies as of 11/01/2019   No Known Allergies     Medication List       Accurate as of November 01, 2019  8:54 AM. If you have any questions, ask your nurse or doctor.        clomiPHENE 50 MG tablet Commonly known as: CLOMID TAKE 1 TABLET(50 MG)  BY MOUTH DAILY   losartan-hydrochlorothiazide 100-12.5 MG tablet Commonly known as: HYZAAR Take 1 tablet by mouth daily.   rosuvastatin 40 MG tablet Commonly known as: CRESTOR Take 40 mg by mouth daily.   sildenafil 20 MG tablet Commonly known as: REVATIO Take 3 to 5 tablets two hours before intercouse on an empty stomach.  Do not take with nitrates.       Allergies: No Known Allergies  Family History: Family History  Problem Relation Age of Onset  . Stroke Father   . Breast cancer Mother   . Kidney disease Neg Hx   . Prostate cancer Neg Hx     Social History:  reports that he has quit smoking. He has quit using smokeless tobacco. He reports current alcohol use. He reports that he does not use drugs.  ROS: UROLOGY Frequent Urination?: No Hard to postpone urination?: No Burning/pain with urination?: No Get up at night to urinate?: No Leakage of urine?: No Urine stream starts and stops?: No Trouble starting stream?: No Do you have to strain to urinate?: No Blood in urine?: No Urinary tract infection?: No Sexually transmitted disease?: No Injury to kidneys or bladder?: No Painful intercourse?: No Weak stream?: No Erection problems?: No Penile pain?: No  Gastrointestinal Nausea?: No Vomiting?: No Indigestion/heartburn?: No Diarrhea?: No Constipation?: No  Constitutional Fever: No Night sweats?: No Weight loss?: No Fatigue?: No  Skin Skin rash/lesions?: No Itching?: No  Eyes Blurred vision?: No Double vision?: No  Ears/Nose/Throat Sore throat?: No Sinus problems?: No  Hematologic/Lymphatic Swollen glands?: No Easy bruising?: No  Cardiovascular Leg swelling?: No Chest pain?: No  Respiratory Cough?: No Shortness of breath?: No  Endocrine Excessive thirst?: No  Musculoskeletal Back pain?: No Joint pain?: No  Neurological Headaches?: No Dizziness?: No  Psychologic Depression?: No Anxiety?: No  Physical Exam: BP (!) 162/94    Pulse 87   Ht 5\' 8"  (1.727 m)   Wt 165 lb (74.8 kg)   BMI 25.09 kg/m   Constitutional:  Well nourished. Alert and oriented, No acute distress. HEENT: [place] AT, mask in place.  Trachea midline, no masses. Cardiovascular: No clubbing, cyanosis, or edema. Respiratory: Normal respiratory effort, no increased work of breathing. GI: Abdomen is soft, non tender, non distended, no abdominal masses. Liver and  spleen not palpable.  No hernias appreciated.  Stool sample for occult testing is not indicated.   GU: No CVA tenderness.  No bladder fullness or masses.  Patient with circumcised phallus.  Urethral meatus is patent.  No penile discharge. No penile lesions or rashes. Scrotum without lesions, cysts, rashes and/or edema.  Testicles are located scrotally bilaterally. No masses are appreciated in the testicles. Left and right epididymis are normal. Rectal: Patient with  normal sphincter tone. Anus and perineum without scarring or rashes. No rectal masses are appreciated. Prostate is approximately 55 grams, no nodules are appreciated. Seminal vesicles could not be palpated Skin: No rashes, bruises or suspicious lesions. Lymph: No inguinal adenopathy. Neurologic: Grossly intact, no focal deficits, moving all 4 extremities. Psychiatric: Normal mood and affect.  Laboratory Data: PSA History:  1.0 ng/mL on 08/03/2013  1.2 ng/mL on 02/01/2014  1.4 ng/mL on 08/03/2014  1.4 ng/mL on 02/02/2015   Component     Latest Ref Rng & Units 08/21/2015 02/19/2016 08/18/2016 02/11/2017  Prostate Specific Ag, Serum     0.0 - 4.0 ng/mL 1.3 1.7 1.5 1.8   Component     Latest Ref Rng & Units 08/18/2017 08/10/2018 03/23/2019 04/04/2019  Prostate Specific Ag, Serum     0.0 - 4.0 ng/mL 1.9 2.2 3.3 3.0   Component     Latest Ref Rng & Units 07/08/2019 10/03/2019  Prostate Specific Ag, Serum     0.0 - 4.0 ng/mL 2.0 3.0   I have reviewed the labs.   Assessment & Plan:    1. PSA elevation Explained to Mr. Saddler that  the increase in his PSA is concerning for a possible prostate cancer  We discussed undergoing a MRI of the prostate or a biopsy of the prostate - explained that the biopsy is the only test of the two that would give actual tissue samples and MRI of prostate can miss up to 20% of prostate cancer He stated he would like to discuss this further with his PCP at his appointment in February  I expressed that I would strongly recommend undergoing the prostate biopsy or at the very least the prostate MRI - he will discontinue the Clomid in the interim   2. Testosterone deficiency:    Most recent testosterone level is 484 ng/dL on 09/2019; LFT's, HBG and HCT normal Hold Clomid 50 mg, 1/2 tablet daily at this time   3. BPH with LUTS IPSS score is 1/1, it is improved Continue conservative management, avoiding bladder irritants and timed voiding's  4. Erectile dysfunction:    SHIM score is 22, it is improved  RTC in 12 months for SHIM score and exam, as testosterone therapy can affect erections  Return for follow up after Dr. Doy Hutching visit .  Zara Council, PA-C  [facility] [address] [place], [place] [address] [phone]

## 2019-11-01 ENCOUNTER — Ambulatory Visit: Payer: BC Managed Care – PPO | Admitting: Urology

## 2019-11-01 ENCOUNTER — Other Ambulatory Visit: Payer: Self-pay

## 2019-11-01 ENCOUNTER — Encounter: Payer: Self-pay | Admitting: Urology

## 2019-11-01 VITALS — BP 162/94 | HR 87 | Ht 68.0 in | Wt 165.0 lb

## 2019-11-01 DIAGNOSIS — E349 Endocrine disorder, unspecified: Secondary | ICD-10-CM

## 2019-11-01 DIAGNOSIS — N401 Enlarged prostate with lower urinary tract symptoms: Secondary | ICD-10-CM

## 2019-11-01 DIAGNOSIS — N529 Male erectile dysfunction, unspecified: Secondary | ICD-10-CM

## 2019-11-01 DIAGNOSIS — N138 Other obstructive and reflux uropathy: Secondary | ICD-10-CM

## 2019-11-01 DIAGNOSIS — R972 Elevated prostate specific antigen [PSA]: Secondary | ICD-10-CM | POA: Diagnosis not present

## 2019-11-01 MED ORDER — SILDENAFIL CITRATE 20 MG PO TABS: ORAL_TABLET | ORAL | 3 refills | Status: DC

## 2019-11-30 ENCOUNTER — Telehealth: Payer: Self-pay | Admitting: Urology

## 2019-11-30 NOTE — Telephone Encounter (Signed)
Please call Benjamin Mitchell and ask him if he had discussed the prostate biopsy with Dr. Doy Hutching and if he is willing to schedule this at this time?

## 2019-11-30 NOTE — Telephone Encounter (Signed)
Spoke to patient and he states he did talk to Dr. Doy Hutching and he states he is scheduled to have PSA drawn again in April. He states Dr. Doy Hutching is going to reach out to you at that time.

## 2020-02-08 NOTE — Telephone Encounter (Signed)
Please let Mr. Glaude know that Dr. Doy Hutching let me know that his recent PSA has come down to 2.0.  I would like to see him in June for I PSS, SHIM and exam with testosterone before 10 am, HCT, Hbg, PSA and LFT's prior.

## 2020-02-13 NOTE — Telephone Encounter (Signed)
LMOM for patient to return call.

## 2020-02-14 ENCOUNTER — Telehealth: Payer: Self-pay | Admitting: Urology

## 2020-02-14 MED ORDER — CLOMIPHENE CITRATE 50 MG PO TABS: ORAL_TABLET | ORAL | 3 refills | Status: DC

## 2020-02-14 NOTE — Telephone Encounter (Signed)
Patient notified and voiced understanding. Appointments have been scheduled. Patient wants to know if he is to start Clomid he hasn't taken it since January.

## 2020-02-14 NOTE — Telephone Encounter (Signed)
Pt. States he needs his prescription sent to [facility] on [address]. It was sent to [facility].

## 2020-02-14 NOTE — Telephone Encounter (Signed)
Yes.  He may restart the Clomid at this time.

## 2020-02-14 NOTE — Telephone Encounter (Signed)
Patient notified and RX sent to pharmacy.

## 2020-04-12 ENCOUNTER — Other Ambulatory Visit: Payer: Self-pay

## 2020-04-12 DIAGNOSIS — N401 Enlarged prostate with lower urinary tract symptoms: Secondary | ICD-10-CM

## 2020-04-12 DIAGNOSIS — E349 Endocrine disorder, unspecified: Secondary | ICD-10-CM

## 2020-04-13 ENCOUNTER — Other Ambulatory Visit: Payer: Self-pay

## 2020-04-13 ENCOUNTER — Other Ambulatory Visit: Payer: BC Managed Care – PPO

## 2020-04-13 DIAGNOSIS — N401 Enlarged prostate with lower urinary tract symptoms: Secondary | ICD-10-CM

## 2020-04-13 DIAGNOSIS — E349 Endocrine disorder, unspecified: Secondary | ICD-10-CM

## 2020-04-14 LAB — HEMOGLOBIN: Hemoglobin: 14.1 g/dL (ref 13.0–17.7)

## 2020-04-14 LAB — HEPATIC FUNCTION PANEL
ALT: 21 IU/L (ref 0–44)
AST: 20 IU/L (ref 0–40)
Albumin: 4.5 g/dL (ref 4.0–5.0)
Alkaline Phosphatase: 42 IU/L — ABNORMAL LOW (ref 48–121)
Bilirubin Total: 0.3 mg/dL (ref 0.0–1.2)
Bilirubin, Direct: 0.11 mg/dL (ref 0.00–0.40)
Total Protein: 6.8 g/dL (ref 6.0–8.5)

## 2020-04-14 LAB — TESTOSTERONE: Testosterone: 524 ng/dL (ref 264–916)

## 2020-04-14 LAB — PSA: Prostate Specific Ag, Serum: 3.3 ng/mL (ref 0.0–4.0)

## 2020-04-14 LAB — HEMATOCRIT: Hematocrit: 41.6 % (ref 37.5–51.0)

## 2020-04-17 ENCOUNTER — Ambulatory Visit: Payer: BC Managed Care – PPO | Admitting: Urology

## 2020-04-17 ENCOUNTER — Encounter: Payer: Self-pay | Admitting: Urology

## 2020-04-17 ENCOUNTER — Other Ambulatory Visit: Payer: Self-pay

## 2020-04-17 VITALS — BP 159/106 | HR 106 | Ht 68.0 in | Wt 167.0 lb

## 2020-04-17 DIAGNOSIS — E349 Endocrine disorder, unspecified: Secondary | ICD-10-CM

## 2020-04-17 DIAGNOSIS — N401 Enlarged prostate with lower urinary tract symptoms: Secondary | ICD-10-CM

## 2020-04-17 DIAGNOSIS — R972 Elevated prostate specific antigen [PSA]: Secondary | ICD-10-CM | POA: Diagnosis not present

## 2020-04-17 DIAGNOSIS — N138 Other obstructive and reflux uropathy: Secondary | ICD-10-CM

## 2020-04-17 DIAGNOSIS — N529 Male erectile dysfunction, unspecified: Secondary | ICD-10-CM | POA: Diagnosis not present

## 2020-04-17 MED ORDER — CLOMIPHENE CITRATE 50 MG PO TABS: ORAL_TABLET | ORAL | 0 refills | Status: DC

## 2020-04-17 MED ORDER — SILDENAFIL CITRATE 20 MG PO TABS: ORAL_TABLET | ORAL | 3 refills | Status: DC

## 2020-04-17 NOTE — Progress Notes (Addendum)
8:54 AM   Benjamin Mitchell 1971-06-21 [id number]  Referring provider: Idelle Crouch, MD [facility] [place],  [place] [address]      Chief Complaint  Patient presents with  . Elevated PSA    6 month F/U    HPI: Patient is a 49 year old male with testosterone deficiency, erectile dysfunction and BPH with LUTS who presents today for 6 month follow-up.  Increased PSA Velocity Component     Latest Ref Rng & Units 02/11/2017 08/18/2017 08/10/2018 03/23/2019  Prostate Specific Ag, Serum     0.0 - 4.0 ng/mL 1.8 1.9 2.2 3.3   Component     Latest Ref Rng & Units 04/04/2019 07/08/2019 10/03/2019 04/13/2020  Prostate Specific Ag, Serum     0.0 - 4.0 ng/mL 3.0 2.0 3.0 3.3   Discussed with patient at his January visit regarding the increasing PSA velocity and recommended a prostate MRI or prostate biopsy at that time - he deferred to his PCP who repeated his PSA and it returned at 2.00 on 02/01/2020.  Testosterone deficiency He is having spontaneous erections at night.  He does not have sleep apnea.   His current testosterone level on 04/13/20 was 524 ng/dL on 09/2019.  LFT's, Hgb and HCT are normal.  He is currently managing his testosterone deficiency with Clomid 50 mg, 1/2 daily.     Erectile dysfunction His SHIM score is 21, which is mild ED.   His previous SHIM was 22.  He has been having difficulty with erections for four years.   His major complaint is achieving erections.  His libido is preserved.   His risk factors for ED are former smoker, age, BPH, testosterone deficiency, hyperlipidemia and HTN.  He denies any painful erections or curvatures with his erections.           SHIM           Row Name 11/01/19 [phone]               SHIM: Over the last 6 months:    How do you rate your confidence that you could get and keep an erection?  High      When you had erections with sexual stimulation, how often were your erections hard enough for  penetration (entering your partner)?  Most Times (much more than half the time)      During sexual intercourse, how often were you able to maintain your erection after you had penetrated (entered) your partner?  Most Times (much more than half the time)      During sexual intercourse, how difficult was it to maintain your erection to completion of intercourse?  Not Difficult      When you attempted sexual intercourse, how often was it satisfactory for you?  Almost Always or Always             SHIM Total Score    SHIM  22         Score: 1-7 Severe ED 8-11 Moderate ED 12-16 Mild-Moderate ED 17-21 Mild ED 22-25 No ED   BPH WITH LUTS His IPSS score today is 3/0, which is mild lower urinary tract symptomatology. He is delighted  with his quality life due to his urinary symptoms. His previous IPSS score was 1/1. He has no complaints today.  He denies any dysuria, hematuria or suprapubic pain.  He also denies any recent fevers, chills, nausea or vomiting.  He does not have a family history of  PCa.        IPSS           Row Name 11/01/19 0800               International Prostate Symptom Score    How often have you had the sensation of not emptying your bladder?  Not at All      How often have you had to urinate less than every two hours?  Not at All      How often have you found you stopped and started again several times when you urinated?  Not at All      How often have you found it difficult to postpone urination?  Not at All      How often have you had a weak urinary stream?  Not at All      How often have you had to strain to start urination?  Not at All      How many times did you typically get up at night to urinate?  1 Time      Total IPSS Score  1             Quality of Life due to urinary symptoms    If you were to spend the rest of your life with your urinary condition just the way it is now how would you feel about that?   Pleased         Score:  1-7 Mild 8-19 Moderate 20-35 Severe   PMH:     Past Medical History:  Diagnosis Date  . BPH with obstruction/lower urinary tract symptoms   . HLD (hyperlipidemia)   . HTN (hypertension)   . Hypogonadism in male     Surgical History:      Past Surgical History:  Procedure Laterality Date  . FOOT FRACTURE SURGERY Bilateral     Home Medications:  Allergies as of 11/01/2019   No Known Allergies        Medication List       Accurate as of November 01, 2019  8:54 AM. If you have any questions, ask your nurse or doctor.        clomiPHENE 50 MG tablet Commonly known as: CLOMID TAKE 1 TABLET(50 MG) BY MOUTH DAILY   losartan-hydrochlorothiazide 100-12.5 MG tablet Commonly known as: HYZAAR Take 1 tablet by mouth daily.   rosuvastatin 40 MG tablet Commonly known as: CRESTOR Take 40 mg by mouth daily.   sildenafil 20 MG tablet Commonly known as: REVATIO Take 3 to 5 tablets two hours before intercouse on an empty stomach.  Do not take with nitrates.       Allergies: No Known Allergies  Family History:      Family History  Problem Relation Age of Onset  . Stroke Father   . Breast cancer Mother   . Kidney disease Neg Hx   . Prostate cancer Neg Hx     Social History:  reports that he has quit smoking. He has quit using smokeless tobacco. He reports current alcohol use. He reports that he does not use drugs.  ROS: UROLOGY Frequent Urination?: No Hard to postpone urination?: No Burning/pain with urination?: No Get up at night to urinate?: No Leakage of urine?: No Urine stream starts and stops?: No Trouble starting stream?: No Do you have to strain to urinate?: No Blood in urine?: No Urinary tract infection?: No Sexually transmitted disease?: No Injury to kidneys or bladder?: No Painful intercourse?: No Weak  stream?: No Erection problems?: No Penile pain?: No  Gastrointestinal Nausea?:  No Vomiting?: No Indigestion/heartburn?: No Diarrhea?: No Constipation?: No  Constitutional Fever: No Night sweats?: No Weight loss?: No Fatigue?: No  Skin Skin rash/lesions?: No Itching?: No  Eyes Blurred vision?: No Double vision?: No  Ears/Nose/Throat Sore throat?: No Sinus problems?: No  Hematologic/Lymphatic Swollen glands?: No Easy bruising?: No  Cardiovascular Leg swelling?: No Chest pain?: No  Respiratory Cough?: No Shortness of breath?: No  Endocrine Excessive thirst?: No  Musculoskeletal Back pain?: No Joint pain?: No  Neurological Headaches?: No Dizziness?: No  Psychologic Depression?: No Anxiety?: No  Physical Exam: Blood pressure (!) 159/106, pulse (!) 106, height 5\' 8"  (1.727 m), weight 167 lb (75.8 kg). Constitutional:  Well nourished. Alert and oriented, No acute distress. HEENT: [place] AT, mask in place.  Trachea midline Cardiovascular: No clubbing, cyanosis, or edema. Respiratory: Normal respiratory effort, no increased work of breathing. GI: Abdomen is soft, non tender, non distended, no abdominal masses. Liver and spleen not palpable.  No hernias appreciated.  Stool sample for occult testing is not indicated.   GU: No CVA tenderness.  No bladder fullness or masses.  Patient with circumcised phallus.  Urethral meatus is patent.  No penile discharge. No penile lesions or rashes. Scrotum without lesions, cysts, rashes and/or edema.  Testicles are located scrotally bilaterally. No masses are appreciated in the testicles. Left and right epididymis are normal. Rectal: Patient with  normal sphincter tone. Anus and perineum without scarring or rashes. No rectal masses are appreciated. Prostate is approximately 50 grams, no nodules are appreciated. Seminal vesicles could not be palpated Skin: No rashes, bruises or suspicious lesions. Lymph: No inguinal adenopathy. Neurologic: Grossly intact, no focal deficits, moving all 4  extremities. Psychiatric: Normal mood and affect.  Laboratory Data: PSA History:             1.0 ng/mL on 08/03/2013             1.2 ng/mL on 02/01/2014             1.4 ng/mL on 08/03/2014             1.4 ng/mL on 02/02/2015              Component     Latest Ref Rng & Units 08/21/2015 02/19/2016 08/18/2016 02/11/2017  Prostate Specific Ag, Serum     0.0 - 4.0 ng/mL 1.3 1.7 1.5 1.8   Component     Latest Ref Rng & Units 08/18/2017 08/10/2018 03/23/2019 04/04/2019  Prostate Specific Ag, Serum     0.0 - 4.0 ng/mL 1.9 2.2 3.3 3.0   Component     Latest Ref Rng & Units 07/08/2019 10/03/2019  Prostate Specific Ag, Serum     0.0 - 4.0 ng/mL 2.0 3.0   I have reviewed the labs.   Assessment & Plan:    1. PSA elevation Explained to Mr. Cervi that the increase in his PSA is concerning for a possible prostate cancer  We discussed undergoing a MRI of the prostate or a biopsy of the prostate - explained that the biopsy is the only test of the two that would give actual tissue samples and MRI of prostate can miss up to 20% of prostate cancer Patient decided that he would like to undergo the prostate MRI at this time. Return to clinic to discuss prostate MRI results.  2. Testosterone deficiency:    Most recent testosterone level is 524 ng/dL on 04/13/20; LFT's,  HBG and HCT normal Continue Clomid 50 mg, 1/2 tablet daily at this time   3. BPH with LUTS IPSS score is 3/0, it slightly worsened. Continue conservative management, avoiding bladder irritants and timed voiding's Prostate MRI pending Return for prostate MRI results  4. Erectile dysfunction:    SHIM score is 21, it is worsened RTC in 12 months for SHIM score and exam, as testosterone therapy can affect erections   Zara Council, PA-C  [place], [place] [address] [phone]  I, Joneen Boers Peace, am acting as a Education administrator for [employer], PA-C  I have  reviewed the above documentation for accuracy and completeness, and I agree with the above.    Zara Council, PA-C

## 2020-06-06 ENCOUNTER — Ambulatory Visit
Admission: RE | Admit: 2020-06-06 | Discharge: 2020-06-06 | Disposition: A | Discharge status: Home / Self Care | Payer: BC Managed Care – PPO | Source: Ambulatory Visit | Attending: Urology | Admitting: Urology

## 2020-06-06 ENCOUNTER — Other Ambulatory Visit: Payer: Self-pay

## 2020-06-06 DIAGNOSIS — N401 Enlarged prostate with lower urinary tract symptoms: Secondary | ICD-10-CM | POA: Insufficient documentation

## 2020-06-06 DIAGNOSIS — R972 Elevated prostate specific antigen [PSA]: Secondary | ICD-10-CM | POA: Insufficient documentation

## 2020-06-06 DIAGNOSIS — N138 Other obstructive and reflux uropathy: Secondary | ICD-10-CM | POA: Diagnosis present

## 2020-06-06 MED ORDER — GADOBUTROL 1 MMOL/ML IV SOLN
7.5000 mL | Freq: Once | INTRAVENOUS | Status: AC | PRN
  Administered 2020-06-06: 7.5 mL via INTRAVENOUS

## 2020-06-11 NOTE — Progress Notes (Signed)
8:29 AM   Benjamin Mitchell Sep 05, 1971 [id number]  Referring provider: Idelle Crouch, MD [facility] [place],  [place] [address]  Chief Complaint  Patient presents with  . Follow-up    HPI: Patient is a 49 year old male with testosterone deficiency, erectile dysfunction, rising PSA who presents today for to discuss prostate MRI results.    Increasing PSA  1.0 ng/mL on 08/03/2013 1.2 ng/mL on 02/01/2014 1.4 ng/mL on 08/03/2014 1.4 ng/mL on 02/02/2015 Component     Latest Ref Rng & Units 08/21/2015 02/19/2016 08/18/2016 02/11/2017  Prostate Specific Ag, Serum     0.0 - 4.0 ng/mL 1.3 1.7 1.5 1.8   Component     Latest Ref Rng & Units 08/18/2017 08/10/2018 03/23/2019 04/04/2019  Prostate Specific Ag, Serum     0.0 - 4.0 ng/mL 1.9 2.2 3.3 3.0   Component     Latest Ref Rng & Units 07/08/2019 10/03/2019 04/13/2020  Prostate Specific Ag, Serum     0.0 - 4.0 ng/mL 2.0 3.0 3.3   Prostate MRI 05/2020 No findings of high-grade or macroscopic prostate carcinoma.  Heterogeneous T2 signal and early post-contrast enhancement throughout the peripheral zone, most significant in the apex.  Nonspecific, but can be seen with prostatitis.  PMH: Past Medical History:  Diagnosis Date  . BPH with obstruction/lower urinary tract symptoms   . HLD (hyperlipidemia)   . HTN (hypertension)   . Hypogonadism in male     Surgical History: Past Surgical History:  Procedure Laterality Date  . FOOT FRACTURE SURGERY Bilateral     Home Medications:  Allergies as of 06/12/2020   No Known Allergies     Medication List       Accurate as of June 12, 2020  8:29 AM. If you have any questions, ask your nurse or doctor.        amLODipine 5 MG tablet Commonly known as: NORVASC Take 5 mg by mouth daily.   clomiPHENE 50 MG tablet Commonly known as: CLOMID TAKE 1 TABLET(50 MG) BY MOUTH DAILY   hydrochlorothiazide 25 MG tablet Commonly known as: HYDRODIURIL Take  12.5 mg by mouth daily.   losartan 100 MG tablet Commonly known as: COZAAR Take 100 mg by mouth daily.   losartan-hydrochlorothiazide 100-12.5 MG tablet Commonly known as: HYZAAR Take 1 tablet by mouth daily.   rosuvastatin 40 MG tablet Commonly known as: CRESTOR Take 40 mg by mouth daily.   sildenafil 20 MG tablet Commonly known as: REVATIO Take 3 to 5 tablets two hours before intercouse on an empty stomach.  Do not take with nitrates.       Allergies: No Known Allergies  Family History: Family History  Problem Relation Age of Onset  . Stroke Father   . Breast cancer Mother   . Kidney disease Neg Hx   . Prostate cancer Neg Hx     Social History:  reports that he has quit smoking. He has quit using smokeless tobacco. He reports current alcohol use. He reports that he does not use drugs.  ROS: For pertinent review of systems please refer to history of present illness  Physical Exam: BP (!) 158/99   Pulse 89   Ht 5\' 8"  (1.727 m)   Wt 165 lb (74.8 kg)   BMI 25.09 kg/m   Constitutional:  Well nourished. Alert and oriented, No acute distress. HEENT:  AT, mask in place.  Trachea midline Cardiovascular: No clubbing, cyanosis, or edema. Respiratory: Normal respiratory effort,  no increased work of breathing. Neurologic: Grossly intact, no focal deficits, moving all 4 extremities. Psychiatric: Normal mood and affect.  Laboratory Data: See HPI I have reviewed the labs.   Assessment & Plan:    1. PSA elevation We discussed the results of his prostate MRI today.  He had also seen the results on MyChart prior to his appointment.  I discussed that even though there was no findings of a high-grade prostate cancer there is an approximately 15-20% false negative rate on MRI.  I also explained that his PSA is elevated for his age range, but he is still not wanting to undergo a prostate biopsy at this time.  He understands the risk of missing a high grade cancer.  Because of  this, I recommend to continue with biannual DRE's and PSA.  If the PSA continues to trend upward, I highly encourage he undergo a biopsy  He will follow up in December for his blood work (testosterone before 10 am, PSA, HCT, LFT's and Hgb), I PSS, SHIM and exam  No follow-ups on file.  Zara Council, PA-C  [place] [address] [place], [place] [address] [phone]  I spent 20 minutes on the day of the encounter to include pre-visit record review, face-to-face time with the patient, and post-visit ordering of tests.

## 2020-06-12 ENCOUNTER — Encounter: Payer: Self-pay | Admitting: Urology

## 2020-06-12 ENCOUNTER — Ambulatory Visit: Payer: BC Managed Care – PPO | Admitting: Urology

## 2020-06-12 ENCOUNTER — Other Ambulatory Visit: Payer: Self-pay

## 2020-06-12 VITALS — BP 158/99 | HR 89 | Ht 68.0 in | Wt 165.0 lb

## 2020-06-12 DIAGNOSIS — R972 Elevated prostate specific antigen [PSA]: Secondary | ICD-10-CM

## 2020-12-12 ENCOUNTER — Other Ambulatory Visit: Payer: Self-pay | Admitting: *Deleted

## 2020-12-12 DIAGNOSIS — R972 Elevated prostate specific antigen [PSA]: Secondary | ICD-10-CM

## 2020-12-12 DIAGNOSIS — N138 Other obstructive and reflux uropathy: Secondary | ICD-10-CM

## 2020-12-13 ENCOUNTER — Other Ambulatory Visit: Payer: Self-pay

## 2020-12-13 ENCOUNTER — Other Ambulatory Visit: Payer: BC Managed Care – PPO

## 2020-12-13 DIAGNOSIS — N138 Other obstructive and reflux uropathy: Secondary | ICD-10-CM

## 2020-12-13 DIAGNOSIS — R972 Elevated prostate specific antigen [PSA]: Secondary | ICD-10-CM

## 2020-12-14 LAB — PSA: Prostate Specific Ag, Serum: 3.5 ng/mL (ref 0.0–4.0)

## 2020-12-14 LAB — HEPATIC FUNCTION PANEL
ALT: 23 IU/L (ref 0–44)
AST: 23 IU/L (ref 0–40)
Albumin: 4.6 g/dL (ref 4.0–5.0)
Alkaline Phosphatase: 40 IU/L — ABNORMAL LOW (ref 44–121)
Bilirubin Total: 0.4 mg/dL (ref 0.0–1.2)
Bilirubin, Direct: 0.12 mg/dL (ref 0.00–0.40)
Total Protein: 7.1 g/dL (ref 6.0–8.5)

## 2020-12-14 LAB — HEMOGLOBIN AND HEMATOCRIT, BLOOD
Hematocrit: 42.1 % (ref 37.5–51.0)
Hemoglobin: 14 g/dL (ref 13.0–17.7)

## 2020-12-18 NOTE — Progress Notes (Unsigned)
8:55 AM   Benjamin Mitchell June 10, 1971 [id number]  Referring provider: Idelle Crouch, MD [facility] [place],  [place] [address]  Chief Complaint  Patient presents with  . Benign Prostatic Hypertrophy  . testosterone deficiency   Urological history: 1. Increase in PSA velocity - PSA trend  - 1.0 ng/mL on 08/03/2013  - 1.2 ng/mL on 02/01/2014  - 1.4 ng/mL on 08/03/2014  - 1.4 ng/mL on 02/02/2015 Component     Latest Ref Rng & Units 08/21/2015  Prostate Specific Ag, Serum     0.0 - 4.0 ng/mL 1.3    Component     Latest Ref Rng & Units 02/19/2016 08/18/2016 02/11/2017 08/18/2017  Prostate Specific Ag, Serum     0.0 - 4.0 ng/mL 1.7 1.5 1.8 1.9   Component     Latest Ref Rng & Units 08/10/2018 03/23/2019 04/04/2019 07/08/2019  Prostate Specific Ag, Serum     0.0 - 4.0 ng/mL 2.2 3.3 3.0 2.0   Component     Latest Ref Rng & Units 10/03/2019 04/13/2020 12/13/2020  Prostate Specific Ag, Serum     0.0 - 4.0 ng/mL 3.0 3.3 3.5   Prostate MRI 05/2020 iPSA 3.3  No findings of high-grade or macroscopic prostate carcinoma.  Heterogeneous T2 signal and early post-contrast enhancement throughout the peripheral zone, most significant in the apex.  Nonspecific, but can be seen with prostatitis.  Prostate volume 23 cc.  PSAD 0.143  2. Testosterone deficiency - testosterone level 524 01/2020 - managed with clomiphene 50 mg, 1/2 daily  3. BPH with LU TS - PSA 3.5 mg/mL in 11/2020 - I PSS 2/0 - PVR 39 mL  4. ED -SHIM 21 - contributing factors of former smoker, age, BPH, testosterone deficiency, hyperlipidemia and HTN - managed with sildenafil 20 mg, on-demand-dosing  HPI: Patient is a 50 year old male who presents today for 6 month follow up.    He has no urinary complaints.  Patient denies any modifying or aggravating factors.  Patient denies any gross hematuria, dysuria or suprapubic/flank pain.  Patient denies any fevers, chills, nausea or vomiting.     IPSS    Row Name 12/19/20 0800         International Prostate Symptom Score   How often have you had the sensation of not emptying your bladder? Not at All     How often have you had to urinate less than every two hours? Not at All     How often have you found you stopped and started again several times when you urinated? Not at All     How often have you found it difficult to postpone urination? Not at All     How often have you had a weak urinary stream? Not at All     How often have you had to strain to start urination? Not at All     How many times did you typically get up at night to urinate? 2 Times     Total IPSS Score 2           Quality of Life due to urinary symptoms   If you were to spend the rest of your life with your urinary condition just the way it is now how would you feel about that? Delighted            Score:  1-7 Mild 8-19 Moderate 20-35 Severe   Patient still having spontaneous erections.   He denies any  pain or curvature with erections.    SHIM    Row Name 12/19/20 [phone]         SHIM: Over the last 6 months:   How do you rate your confidence that you could get and keep an erection? Moderate     When you had erections with sexual stimulation, how often were your erections hard enough for penetration (entering your partner)? Most Times (much more than half the time)     During sexual intercourse, how often were you able to maintain your erection after you had penetrated (entered) your partner? Most Times (much more than half the time)     During sexual intercourse, how difficult was it to maintain your erection to completion of intercourse? Not Difficult     When you attempted sexual intercourse, how often was it satisfactory for you? Almost Always or Always           SHIM Total Score   SHIM 21            Score: 1-7 Severe ED 8-11 Moderate ED 12-16 Mild-Moderate ED 17-21 Mild ED 22-25 No ED  PMH: Past Medical History:  Diagnosis  Date  . BPH with obstruction/lower urinary tract symptoms   . HLD (hyperlipidemia)   . HTN (hypertension)   . Hypogonadism in male     Surgical History: Past Surgical History:  Procedure Laterality Date  . FOOT FRACTURE SURGERY Bilateral     Home Medications:  Allergies as of 12/19/2020      Reactions   Cardura [doxazosin] Nausea Only   Felt nausea, dizzy and tired       Medication List       Accurate as of December 19, 2020  8:55 AM. If you have any questions, ask your nurse or doctor.        STOP taking these medications   doxazosin 2 MG tablet Commonly known as: CARDURA Stopped by: Zaira Iacovelli, PA-C     TAKE these medications   amLODipine 5 MG tablet Commonly known as: NORVASC Take 5 mg by mouth daily.   clomiPHENE 50 MG tablet Commonly known as: CLOMID TAKE 1 TABLET(50 MG) BY MOUTH DAILY   hydrochlorothiazide 25 MG tablet Commonly known as: HYDRODIURIL Take 12.5 mg by mouth daily.   losartan 100 MG tablet Commonly known as: COZAAR Take 100 mg by mouth daily.   losartan-hydrochlorothiazide 100-12.5 MG tablet Commonly known as: HYZAAR Take 1 tablet by mouth daily.   rosuvastatin 40 MG tablet Commonly known as: CRESTOR Take 40 mg by mouth daily.   sildenafil 20 MG tablet Commonly known as: REVATIO Take 3 to 5 tablets two hours before intercouse on an empty stomach.  Do not take with nitrates.       Allergies:  Allergies  Allergen Reactions  . Cardura [Doxazosin] Nausea Only    Felt nausea, dizzy and tired     Family History: Family History  Problem Relation Age of Onset  . Stroke Father   . Breast cancer Mother   . Kidney disease Neg Hx   . Prostate cancer Neg Hx     Social History:  reports that he has quit smoking. He has quit using smokeless tobacco. He reports current alcohol use. He reports that he does not use drugs.  ROS: For pertinent review of systems please refer to history of present illness  Physical Exam: BP (!)  167/93   Pulse 94   Ht 5\' 8"  (1.727 m)   Wt 162  lb (73.5 kg)   BMI 24.63 kg/m   Constitutional:  Well nourished. Alert and oriented, No acute distress. HEENT: [place] AT, mask in place.  Trachea midline Cardiovascular: No clubbing, cyanosis, or edema. Respiratory: Normal respiratory effort, no increased work of breathing. GU: No CVA tenderness.  No bladder fullness or masses.  Patient with circumcised phallus. Urethral meatus is patent.  No penile discharge. No penile lesions or rashes. Scrotum without lesions, cysts, rashes and/or edema.  Testicles are located scrotally bilaterally. No masses are appreciated in the testicles. Left and right epididymis are normal. Rectal: Patient with  normal sphincter tone. Anus and perineum without scarring or rashes. No rectal masses are appreciated. Prostate is approximately 45 grams, no nodules are appreciated. Seminal vesicles are normal. Skin: Condyloma on inner left thigh  Lymph: No inguinal adenopathy. Neurologic: Grossly intact, no focal deficits, moving all 4 extremities. Psychiatric: Normal mood and affect.   Laboratory Data: Component     Latest Ref Rng & Units 12/13/2020  Total Protein     6.0 - 8.5 g/dL 7.1  Albumin     4.0 - 5.0 g/dL 4.6  Total Bilirubin     0.0 - 1.2 mg/dL 0.4  BILIRUBIN, DIRECT     0.00 - 0.40 mg/dL 0.12  Alkaline Phosphatase     44 - 121 IU/L 40 (L)  AST     0 - 40 IU/L 23  ALT     0 - 44 IU/L 23   Component     Latest Ref Rng & Units 12/13/2020  Hemoglobin     13.0 - 17.7 g/dL 14.0  HCT     37.5 - 51.0 % 42.1   Specimen:  Urine  Ref Range & Units 4 mo ago  Color Yellow Yellow   Clarity Clear Clear   Specific Gravity 1.000 - 1.030 1.015   pH, Urine 5.0 - 8.0 6.5   Protein, Urinalysis Negative, Trace mg/dL Negative   Glucose, Urinalysis Negative mg/dL Negative   Ketones, Urinalysis Negative mg/dL Negative   Blood, Urinalysis Negative TraceAbnormal   Nitrite, Urinalysis Negative Negative   Leukocyte  Esterase, Urinalysis Negative SmallAbnormal   White Blood Cells, Urinalysis None Seen, 0-3 /hpf 0-3   Red Blood Cells, Urinalysis None Seen, 0-3 /hpf 0-3   Bacteria, Urinalysis None Seen /hpf None Seen   Squamous Epithelial Cells, Urinalysis Rare, Few, None Seen /hpf None Seen   Resulting Agency  [facility] - [facility]  Specimen Collected: 08/02/20 7:13 AM Last Resulted: 08/02/20 8:07 AM  Received From: [facility]  Result Received: 12/12/20 3:39 PM   Specimen:  Blood  Ref Range & Units 4 mo ago  Cholesterol, Total 100 - 200 mg/dL 157   Triglyceride 35 - 199 mg/dL 228High   HDL (High Density Lipoprotein) Cholesterol 29.0 - 71.0 mg/dL 46.6   LDL Calculated 0 - 130 mg/dL 65   VLDL Cholesterol mg/dL 46   Cholesterol/HDL Ratio  3.4   Resulting Agency  [facility] - [facility]  Specimen Collected: 08/02/20 7:13 AM Last Resulted: 08/02/20 10:15 AM  Received From: [facility]  Result Received: 12/12/20 3:39 PM   Specimen:  Blood  Ref Range & Units 4 mo ago  WBC (White Blood Cell Count) 4.1 - 10.2 10^3/uL 7.7   RBC (Red Blood Cell Count) 4.69 - 6.13 10^6/uL 4.72   Hemoglobin 14.1 - 18.1 gm/dL 14.2   Hematocrit 40.0 - 52.0 % 41.9   MCV (Mean Corpuscular Volume)  80.0 - 100.0 fl 88.8   MCH (Mean Corpuscular Hemoglobin) 27.0 - 31.2 pg 30.1   MCHC (Mean Corpuscular Hemoglobin Concentration) 32.0 - 36.0 gm/dL 33.9   Platelet Count 150 - 450 10^3/uL 156   RDW-CV (Red Cell Distribution Width) 11.6 - 14.8 % 11.7   MPV (Mean Platelet Volume) 9.4 - 12.4 fl 9.1Low   Neutrophils 1.50 - 7.80 10^3/uL 4.60   Lymphocytes 1.00 - 3.60 10^3/uL 2.25   Monocytes 0.00 - 1.50 10^3/uL 0.59   Eosinophils 0.00 - 0.55 10^3/uL 0.22   Basophils 0.00 - 0.09 10^3/uL 0.06   Neutrophil % 32.0 - 70.0 % 59.4   Lymphocyte % 10.0 - 50.0 % 29.1   Monocyte % 4.0 - 13.0 % 7.6   Eosinophil % 1.0 - 5.0 % 2.8   Basophil% 0.0 - 2.0 % 0.8   Immature Granulocyte % <=0.7 % 0.3    Immature Granulocyte Count <=0.06 10^3/L 0.02   Resulting Agency  [facility] - [facility]  Specimen Collected: 08/02/20 7:13 AM Last Resulted: 08/02/20 8:33 AM  Received From: [facility]  Result Received: 12/12/20 3:39 PM   I have reviewed the labs.  Pertinent Imaging Results for Benjamin Mitchell, Benjamin "Benjamin Mitchell" (MRN [id number]) as of 12/19/2020 08:39  Ref. Range 12/19/2020 08:33  Scan Result Unknown 39 ml     Assessment & Plan:    1. Increase in PSA velocity - discussed that his current PSA of 3.5 is above the age specific PSA range for his age group - reviewed that the false negative rate on prostate MR is 15-20% - PLUM risk of 6.1% of high-grade prostate cancer - we discussed pursing a biopsy, drawing a 4K score or discontinuing the Clomid for three months and rechecking his PSA - he would like to discontinue the Clomid and have his PSA recheck in three months  2. Testosterone deficiency - testosterone at therapeutic levels - discontinue clomiphene 50 mg - see #1  3. BPH with LU TS - I PSS improved  - continue conservative management  4. ED - SHIM stable  - continue sildenafil 20 mg, on-demand-dosing - refill sent to [facility]   Return in about 3 months (around 03/18/2021) for PSA only .  Zara Council, PA-C  [facility] [address] [place], [place] [address] [phone]

## 2020-12-19 ENCOUNTER — Other Ambulatory Visit: Payer: Self-pay

## 2020-12-19 ENCOUNTER — Ambulatory Visit: Payer: BC Managed Care – PPO | Admitting: Urology

## 2020-12-19 ENCOUNTER — Encounter: Payer: Self-pay | Admitting: Urology

## 2020-12-19 VITALS — BP 167/93 | HR 94 | Ht 68.0 in | Wt 162.0 lb

## 2020-12-19 DIAGNOSIS — R972 Elevated prostate specific antigen [PSA]: Secondary | ICD-10-CM

## 2020-12-19 DIAGNOSIS — N529 Male erectile dysfunction, unspecified: Secondary | ICD-10-CM | POA: Diagnosis not present

## 2020-12-19 DIAGNOSIS — N401 Enlarged prostate with lower urinary tract symptoms: Secondary | ICD-10-CM

## 2020-12-19 DIAGNOSIS — N138 Other obstructive and reflux uropathy: Secondary | ICD-10-CM

## 2020-12-19 DIAGNOSIS — E349 Endocrine disorder, unspecified: Secondary | ICD-10-CM

## 2020-12-19 LAB — BLADDER SCAN AMB NON-IMAGING: Scan Result: 39

## 2020-12-19 MED ORDER — SILDENAFIL CITRATE 20 MG PO TABS: ORAL_TABLET | ORAL | 3 refills | Status: DC

## 2020-12-20 ENCOUNTER — Telehealth: Payer: Self-pay | Admitting: Family Medicine

## 2020-12-20 ENCOUNTER — Encounter: Payer: Self-pay | Admitting: Family Medicine

## 2020-12-20 LAB — TESTOSTERONE: Testosterone: 645 ng/dL (ref 264–916)

## 2020-12-20 NOTE — Telephone Encounter (Signed)
LMOM notified a Mychart message was sent.

## 2020-12-20 NOTE — Telephone Encounter (Signed)
-----   Message from Nori Riis, PA-C sent at 12/20/2020  8:11 AM EST ----- Please let Mr. Depinto know that his testosterone level is good at 645.  We will recheck it again in three months with his PSA to see what it will be like after being off his Clomid for that time.

## 2021-01-17 IMAGING — MR MR PROSTATE WO/W CM
56 series · 56 of 56 positions shown · IV contrast (7.5ml Gadavist)
Comparison: None.

CLINICAL DATA: Elevated PSA.  No prior biopsy.

EXAM:
MR PROSTATE WITHOUT AND WITH CONTRAST
TECHNIQUE: Multiplanar multisequence MRI images were obtained of the pelvis
centered about the prostate. Pre and post contrast images were
obtained.
CONTRAST:  7.5mL GADAVIST GADOBUTROL 1 MMOL/ML IV SOLN

[Series 3: ax in&out whole · axial · 5.0mm · 0.74mm/px · 1 of 70 slices shown]
[im 1/70]
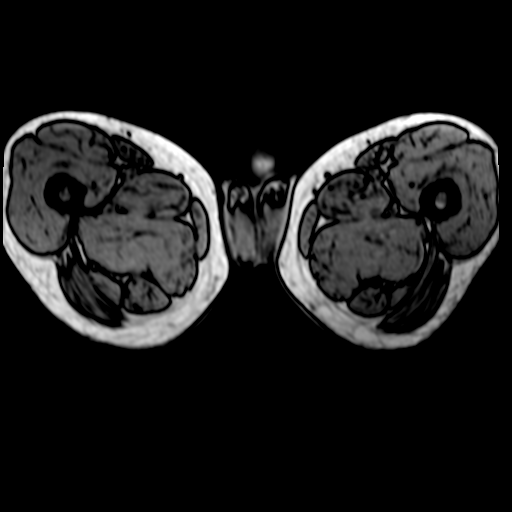

[Series 4: T2 · axial · 3.0mm · 0.56mm/px · 1 of 25 slices shown (1 of 3)]
[im 1/25]
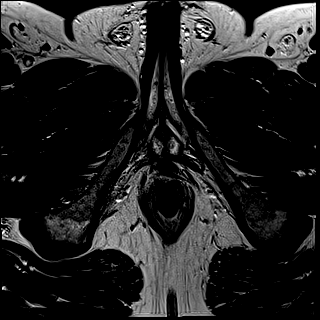

[Series 5: T2 · coronal · 3.0mm · 0.70mm/px · 1 of 35 slices shown (2 of 3)]
[im 1/35]
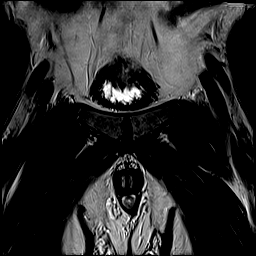

[Series 6: DWI · axial · 3.0mm · 0.86mm/px · 1 of 75 slices shown (1 of 3)]
[im 1/75]
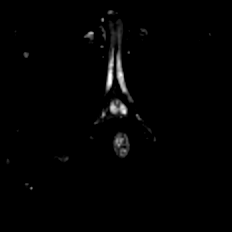

[Series 7: DWI · axial · 3.0mm · 0.86mm/px · 1 of 25 slices shown (2 of 3)]
[im 1/25]
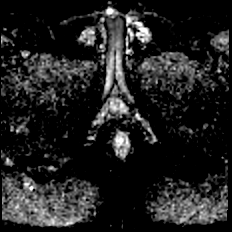

[Series 8: DWI · axial · 3.0mm · 0.86mm/px · 1 of 25 slices shown (3 of 3)]
[im 1/25]
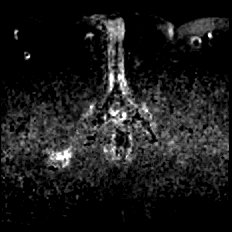

[Series 9: T2 · axial · 1.0mm · 1.04mm/px · 1 of 72 slices shown (3 of 3)]
[im 1/72]
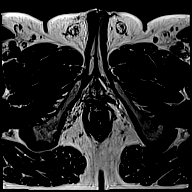

[Series 10: T1 · axial · 3.0mm · 1.15mm/px · 1 of 28 slices shown (1 of 49)]
[im 1/28]
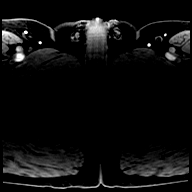

[Series 11: T1 · axial · 3.0mm · 1.15mm/px · 1 of 28 slices shown (2 of 49)]
[im 1/28]
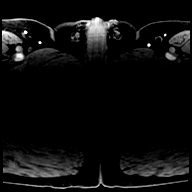

[Series 12: T1 · axial · 3.0mm · 1.15mm/px · 1 of 28 slices shown (3 of 49)]
[im 1/28]
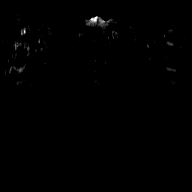

[Series 13: T1 · axial · 3.0mm · 1.15mm/px · 1 of 28 slices shown (4 of 49)]
[im 1/28]
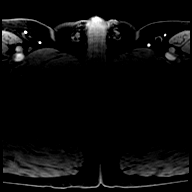

[Series 14: T1 · axial · 3.0mm · 1.15mm/px · 1 of 28 slices shown (5 of 49)]
[im 1/28]
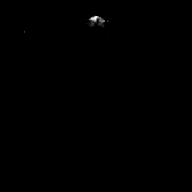

[Series 15: T1 · axial · 3.0mm · 1.15mm/px · 1 of 28 slices shown (6 of 49)]
[im 1/28]
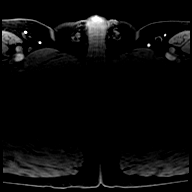

[Series 16: T1 · axial · 3.0mm · 1.15mm/px · 1 of 28 slices shown (7 of 49)]
[im 1/28]
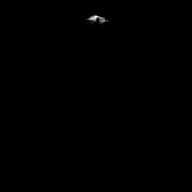

[Series 17: T1 · axial · 3.0mm · 1.15mm/px · 1 of 28 slices shown (8 of 49)]
[im 1/28]
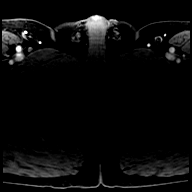

[Series 18: T1 · axial · 3.0mm · 1.15mm/px · 1 of 28 slices shown (9 of 49)]
[im 1/28]
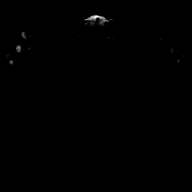

[Series 19: T1 · axial · 3.0mm · 1.15mm/px · 1 of 28 slices shown (10 of 49)]
[im 1/28]
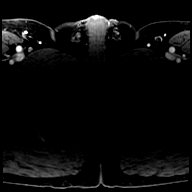

[Series 20: T1 · axial · 3.0mm · 1.15mm/px · 1 of 28 slices shown (11 of 49)]
[im 1/28]
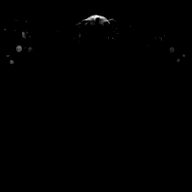

[Series 21: T1 · axial · 3.0mm · 1.15mm/px · 1 of 28 slices shown (12 of 49)]
[im 1/28]
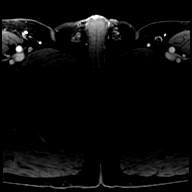

[Series 22: T1 · axial · 3.0mm · 1.15mm/px · 1 of 28 slices shown (13 of 49)]
[im 1/28]
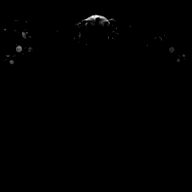

[Series 23: T1 · axial · 3.0mm · 1.15mm/px · 1 of 28 slices shown (14 of 49)]
[im 1/28]
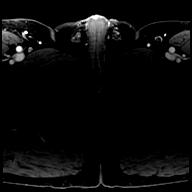

[Series 24: T1 · axial · 3.0mm · 1.15mm/px · 1 of 28 slices shown (15 of 49)]
[im 1/28]
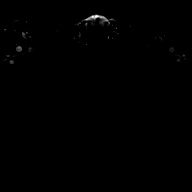

[Series 25: T1 · axial · 3.0mm · 1.15mm/px · 1 of 28 slices shown (16 of 49)]
[im 1/28]
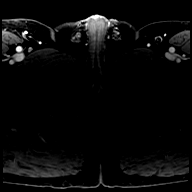

[Series 26: T1 · axial · 3.0mm · 1.15mm/px · 1 of 28 slices shown (17 of 49)]
[im 1/28]
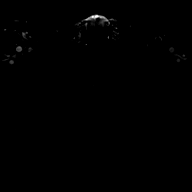

[Series 27: T1 · axial · 3.0mm · 1.15mm/px · 1 of 28 slices shown (18 of 49)]
[im 1/28]
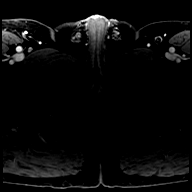

[Series 28: T1 · axial · 3.0mm · 1.15mm/px · 1 of 28 slices shown (19 of 49)]
[im 1/28]
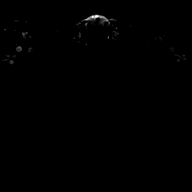

[Series 29: T1 · axial · 3.0mm · 1.15mm/px · 1 of 28 slices shown (20 of 49)]
[im 1/28]
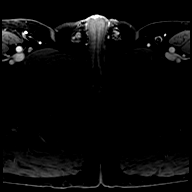

[Series 30: T1 · axial · 3.0mm · 1.15mm/px · 1 of 28 slices shown (21 of 49)]
[im 1/28]
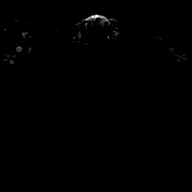

[Series 31: T1 · axial · 3.0mm · 1.15mm/px · 1 of 28 slices shown (22 of 49)]
[im 1/28]
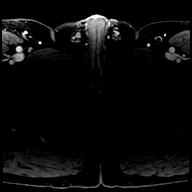

[Series 32: T1 · axial · 3.0mm · 1.15mm/px · 1 of 28 slices shown (23 of 49)]
[im 1/28]
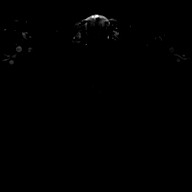

[Series 33: T1 · axial · 3.0mm · 1.15mm/px · 1 of 28 slices shown (24 of 49)]
[im 1/28]
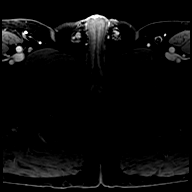

[Series 34: T1 · axial · 3.0mm · 1.15mm/px · 1 of 28 slices shown (25 of 49)]
[im 1/28]
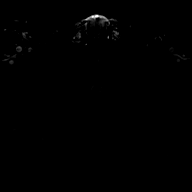

[Series 35: T1 · axial · 3.0mm · 1.15mm/px · 1 of 28 slices shown (26 of 49)]
[im 1/28]
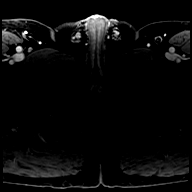

[Series 36: T1 · axial · 3.0mm · 1.15mm/px · 1 of 28 slices shown (27 of 49)]
[im 1/28]
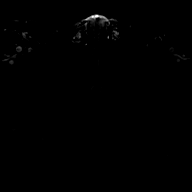

[Series 37: T1 · axial · 3.0mm · 1.15mm/px · 1 of 28 slices shown (28 of 49)]
[im 1/28]
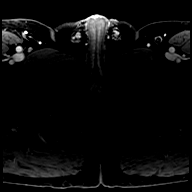

[Series 38: T1 · axial · 3.0mm · 1.15mm/px · 1 of 28 slices shown (29 of 49)]
[im 1/28]
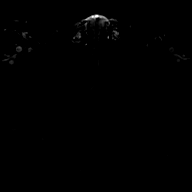

[Series 39: T1 · axial · 3.0mm · 1.15mm/px · 1 of 28 slices shown (30 of 49)]
[im 1/28]
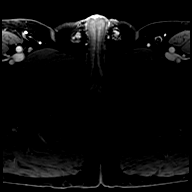

[Series 40: T1 · axial · 3.0mm · 1.15mm/px · 1 of 28 slices shown (31 of 49)]
[im 1/28]
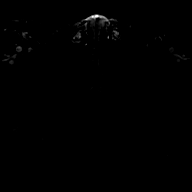

[Series 41: T1 · axial · 3.0mm · 1.15mm/px · 1 of 28 slices shown (32 of 49)]
[im 1/28]
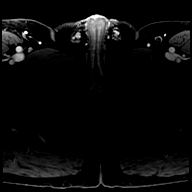

[Series 42: T1 · axial · 3.0mm · 1.15mm/px · 1 of 28 slices shown (33 of 49)]
[im 1/28]
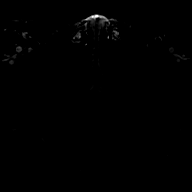

[Series 43: T1 · axial · 3.0mm · 1.15mm/px · 1 of 28 slices shown (34 of 49)]
[im 1/28]
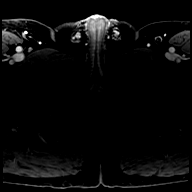

[Series 44: T1 · axial · 3.0mm · 1.15mm/px · 1 of 28 slices shown (35 of 49)]
[im 1/28]
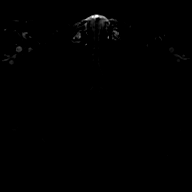

[Series 45: T1 · axial · 3.0mm · 1.15mm/px · 1 of 28 slices shown (36 of 49)]
[im 1/28]
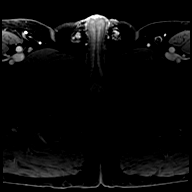

[Series 46: T1 · axial · 3.0mm · 1.15mm/px · 1 of 28 slices shown (37 of 49)]
[im 1/28]
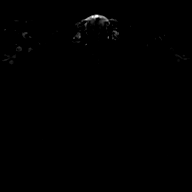

[Series 47: T1 · axial · 3.0mm · 1.15mm/px · 1 of 28 slices shown (38 of 49)]
[im 1/28]
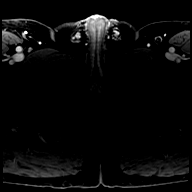

[Series 48: T1 · axial · 3.0mm · 1.15mm/px · 1 of 28 slices shown (39 of 49)]
[im 1/28]
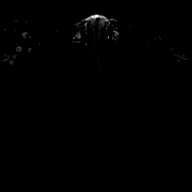

[Series 49: T1 · axial · 3.0mm · 1.15mm/px · 1 of 28 slices shown (40 of 49)]
[im 1/28]
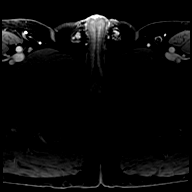

[Series 50: T1 · axial · 3.0mm · 1.15mm/px · 1 of 28 slices shown (41 of 49)]
[im 1/28]
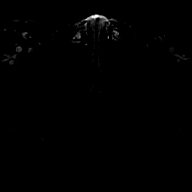

[Series 51: T1 · axial · 3.0mm · 1.15mm/px · 1 of 28 slices shown (42 of 49)]
[im 1/28]
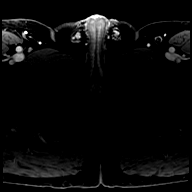

[Series 52: T1 · axial · 3.0mm · 1.15mm/px · 1 of 28 slices shown (43 of 49)]
[im 1/28]
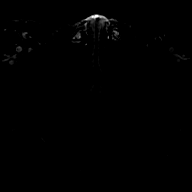

[Series 53: T1 · axial · 3.0mm · 1.15mm/px · 1 of 28 slices shown (44 of 49)]
[im 1/28]
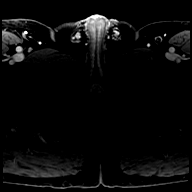

[Series 54: T1 · axial · 3.0mm · 1.15mm/px · 1 of 28 slices shown (45 of 49)]
[im 1/28]
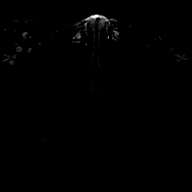

[Series 55: T1 · axial · 3.0mm · 1.15mm/px · 1 of 28 slices shown (46 of 49)]
[im 1/28]
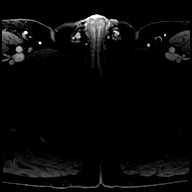

[Series 56: T1 · axial · 3.0mm · 1.15mm/px · 1 of 28 slices shown (47 of 49)]
[im 1/28]
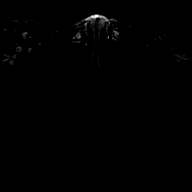

[Series 57: T1 · axial · 3.0mm · 1.15mm/px · 1 of 28 slices shown (48 of 49)]
[im 1/28]
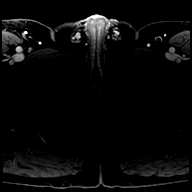

[Series 58: T1 · axial · 3.0mm · 1.15mm/px · 1 of 28 slices shown (49 of 49)]
[im 1/28]
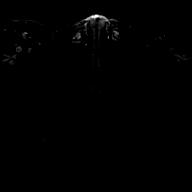

[56 of 56 positions shown; findings below may reference images not displayed]

FINDINGS: Prostate: No significant benign prostatic hyperplasia.

No areas of masslike T2 hypointensity, restricted diffusion, within
the peripheral zone. There is relatively diffuse heterogeneous T2
signal within the peripheral zone, most significant in the apex.
Likely corresponding mild diffuse early post-contrast enhancement,
including on series 20.

Volume:  4.0 x 3.6 by 3.1 cm (volume = 23 cm^3)

Transcapsular spread:  Absent

Seminal vesicle involvement: Absent

Neurovascular bundle involvement: Absent

Pelvic adenopathy: Absent

Bone metastasis: Absent

Other findings: No significant free fluid.  Normal urinary bladder.
IMPRESSION: 1. No findings of high-grade or macroscopic prostate carcinoma.
2. Heterogeneous T2 signal and early post-contrast enhancement
throughout the peripheral zone, most significant in the apex.
Nonspecific, but can be seen with prostatitis.

## 2021-02-12 ENCOUNTER — Other Ambulatory Visit: Payer: Self-pay | Admitting: *Deleted

## 2021-02-12 DIAGNOSIS — E349 Endocrine disorder, unspecified: Secondary | ICD-10-CM

## 2021-03-21 ENCOUNTER — Other Ambulatory Visit: Payer: BC Managed Care – PPO

## 2021-03-21 ENCOUNTER — Other Ambulatory Visit: Payer: Self-pay

## 2021-03-21 DIAGNOSIS — R972 Elevated prostate specific antigen [PSA]: Secondary | ICD-10-CM

## 2021-03-21 DIAGNOSIS — E349 Endocrine disorder, unspecified: Secondary | ICD-10-CM

## 2021-03-22 LAB — PSA: Prostate Specific Ag, Serum: 3.1 ng/mL (ref 0.0–4.0)

## 2021-03-22 LAB — TESTOSTERONE: Testosterone: 285 ng/dL (ref 264–916)

## 2021-03-27 ENCOUNTER — Other Ambulatory Visit: Payer: Self-pay | Admitting: Urology

## 2021-03-28 ENCOUNTER — Telehealth: Payer: Self-pay

## 2021-03-28 NOTE — Telephone Encounter (Signed)
-----   Message from Nori Riis, PA-C sent at 03/27/2021  4:58 PM EDT ----- Please let Mr. Uzzle know that his PSA did not decrease off the Clomid.  I recommend at this time we go ahead with the prostate biopsy as we can get tissue for pathology and be sure no cancer is present so we can continue the Clomid.

## 2021-03-29 NOTE — Telephone Encounter (Signed)
Patient notified, he states he will call back on Monday to schedule he does not have his calendar in front of him now.

## 2021-04-12 DIAGNOSIS — R972 Elevated prostate specific antigen [PSA]: Secondary | ICD-10-CM

## 2021-04-12 NOTE — Telephone Encounter (Signed)
Attempted to reach patient again multiple times to schedule. No answer, mail box full Sent a mychart message to contact the office and schedule his appointment, along with biopsy instructions

## 2021-09-15 ENCOUNTER — Other Ambulatory Visit: Payer: Self-pay | Admitting: Urology

## 2021-10-10 ENCOUNTER — Other Ambulatory Visit: Payer: Self-pay

## 2021-10-11 ENCOUNTER — Other Ambulatory Visit: Payer: Self-pay

## 2021-10-11 ENCOUNTER — Other Ambulatory Visit: Payer: BC Managed Care – PPO

## 2021-10-11 DIAGNOSIS — R972 Elevated prostate specific antigen [PSA]: Secondary | ICD-10-CM

## 2021-10-12 LAB — PSA: Prostate Specific Ag, Serum: 2.7 ng/mL (ref 0.0–4.0)

## 2021-10-14 NOTE — Progress Notes (Signed)
3:27 PM   Benjamin Mitchell November 23, 1970 [id number]  Referring provider: Idelle Crouch, MD [facility] [place],  [place] [address]  Chief Complaint  Patient presents with   Benign Prostatic Hypertrophy   Urological history: 1. Increase in PSA velocity - PSA trend  - 1.0 ng/mL on 08/03/2013  - 1.2 ng/mL on 02/01/2014  - 1.4 ng/mL on 08/03/2014  - 1.4 ng/mL on 02/02/2015 Component     Latest Ref Rng & Units 08/21/2015  Prostate Specific Ag, Serum     0.0 - 4.0 ng/mL 1.3    Component     Latest Ref Rng & Units 02/19/2016 08/18/2016 02/11/2017 08/18/2017  Prostate Specific Ag, Serum     0.0 - 4.0 ng/mL 1.7 1.5 1.8 1.9   Component     Latest Ref Rng & Units 08/10/2018 07/08/2019  Prostate Specific Ag, Serum     0.0 - 4.0 ng/mL 2.2 2.0   Component     Latest Ref Rng & Units 10/03/2019 04/13/2020 12/13/2020  Prostate Specific Ag, Serum     0.0 - 4.0 ng/mL 3.0 3.3 3.5   Component     Latest Ref Rng & Units 04/13/2020 12/13/2020 03/21/2021 10/11/2021  Prostate Specific Ag, Serum     0.0 - 4.0 ng/mL 3.3 3.5 3.1 2.7   Prostate MRI 05/2020 iPSA 3.3  No findings of high-grade or macroscopic prostate carcinoma.  Heterogeneous T2 signal and early post-contrast enhancement throughout the peripheral zone, most significant in the apex.  Nonspecific, but can be seen with prostatitis.  Prostate volume 23 cc.  PSAD 0.143  2. Testosterone deficiency -Discontinued therapy due to rising PSA  3. BPH with LU TS - I PSS 2/0 - PVR 39 mL  4. ED - contributing factors of former smoker, age, BPH, testosterone deficiency, hyperlipidemia and HTN -SHIM 23 - managed with sildenafil 20 mg, on-demand-dosing  HPI: Benjamin Mitchell is a 50 y.o. male who presents today for 6 months follow up.    He has no urinary complaints at this time.  Patient denies any modifying or aggravating factors.  Patient denies any gross hematuria, dysuria or suprapubic/flank pain.  Patient  denies any fevers, chills, nausea or vomiting.     IPSS     Row Name 10/15/21 1500         International Prostate Symptom Score   How often have you had the sensation of not emptying your bladder? Not at All     How often have you had to urinate less than every two hours? Less than 1 in 5 times     How often have you found you stopped and started again several times when you urinated? Not at All     How often have you found it difficult to postpone urination? Not at All     How often have you had a weak urinary stream? Not at All     How often have you had to strain to start urination? Not at All     How many times did you typically get up at night to urinate? 1 Time     Total IPSS Score 2       Quality of Life due to urinary symptoms   If you were to spend the rest of your life with your urinary condition just the way it is now how would you feel about that? Delighted  Score:  1-7 Mild 8-19 Moderate 20-35 Severe  Patient still having spontaneous erections.  He denies any pain or curvature with erections.     SHIM     Row Name 10/15/21 1526         SHIM: Over the last 6 months:   How do you rate your confidence that you could get and keep an erection? Moderate     When you had erections with sexual stimulation, how often were your erections hard enough for penetration (entering your partner)? Almost Always or Always     During sexual intercourse, how often were you able to maintain your erection after you had penetrated (entered) your partner? Almost Always or Always     During sexual intercourse, how difficult was it to maintain your erection to completion of intercourse? Not Difficult     When you attempted sexual intercourse, how often was it satisfactory for you? Almost Always or Always       SHIM Total Score   SHIM 23               Score: 1-7 Severe ED 8-11 Moderate ED 12-16 Mild-Moderate ED 17-21 Mild ED 22-25 No ED  PMH: Past  Medical History:  Diagnosis Date   BPH with obstruction/lower urinary tract symptoms    HLD (hyperlipidemia)    HTN (hypertension)    Hypogonadism in male     Surgical History: Past Surgical History:  Procedure Laterality Date   FOOT FRACTURE SURGERY Bilateral     Home Medications:  Allergies as of 10/15/2021       Reactions   Cardura [doxazosin] Nausea Only   Felt nausea, dizzy and tired         Medication List        Accurate as of October 15, 2021  3:27 PM. If you have any questions, ask your nurse or doctor.          amLODipine 5 MG tablet Commonly known as: NORVASC Take 5 mg by mouth daily.   clomiPHENE 50 MG tablet Commonly known as: CLOMID TAKE 1 TABLET(50 MG) BY MOUTH DAILY   hydrochlorothiazide 25 MG tablet Commonly known as: HYDRODIURIL Take 12.5 mg by mouth daily.   losartan 100 MG tablet Commonly known as: COZAAR Take 100 mg by mouth daily.   losartan-hydrochlorothiazide 100-12.5 MG tablet Commonly known as: HYZAAR Take 1 tablet by mouth daily.   rosuvastatin 40 MG tablet Commonly known as: CRESTOR Take 40 mg by mouth daily.   sildenafil 20 MG tablet Commonly known as: REVATIO TAKE THREE TO FIVE TABLETS FOR FOR ERECTILE DYSFUNCTION 2 HOURS PRIOR TO INTERCOURSE ON AN EMPTY STOMACH DO NOT TAKE WITH NITRATES        Allergies:  Allergies  Allergen Reactions   Cardura [Doxazosin] Nausea Only    Felt nausea, dizzy and tired     Family History: Family History  Problem Relation Age of Onset   Stroke Father    Breast cancer Mother    Kidney disease Neg Hx    Prostate cancer Neg Hx     Social History:  reports that he has quit smoking. He has quit using smokeless tobacco. He reports current alcohol use. He reports that he does not use drugs.  ROS: For pertinent review of systems please refer to history of present illness  Physical Exam: BP (!) 150/91    Pulse 99    Ht '5\' 8"'  (1.727 m)    Wt 167 lb (75.8 kg)  BMI 25.39 kg/m    Constitutional:  Well nourished. Alert and oriented, No acute distress. HEENT:  AT, mask in place.  Trachea midline Cardiovascular: No clubbing, cyanosis, or edema. Respiratory: Normal respiratory effort, no increased work of breathing. GU: No CVA tenderness.  No bladder fullness or masses.  Patient with circumcised phallus.  Urethral meatus is patent.  No penile discharge. No penile lesions or rashes. Scrotum without lesions, cysts, rashes and/or edema.  Testicles are located scrotally bilaterally. No masses are appreciated in the testicles. Left and right epididymis are normal. Rectal: Patient with  normal sphincter tone. Anus and perineum without scarring or rashes. No rectal masses are appreciated. Prostate is approximately 45 grams, no nodules are appreciated. Seminal vesicles could not be palpated Neurologic: Grossly intact, no focal deficits, moving all 4 extremities. Psychiatric: Normal mood and affect.   Laboratory Data: Results for orders placed or performed in visit on 10/11/21  PSA  Result Value Ref Range   Prostate Specific Ag, Serum 2.7 0.0 - 4.0 ng/mL    Glucose 70 - 110 mg/dL 95   Sodium 136 - 145 mmol/L 140   Potassium 3.6 - 5.1 mmol/L 3.6   Chloride 97 - 109 mmol/L 102   Carbon Dioxide (CO2) 22.0 - 32.0 mmol/L 27.7   Urea Nitrogen (BUN) 7 - 25 mg/dL 16   Creatinine 0.7 - 1.3 mg/dL 1.1   Glomerular Filtration Rate (eGFR), MDRD Estimate >60 mL/min/1.73sq m 71   Calcium 8.7 - 10.3 mg/dL 9.4   AST  8 - 39 U/L 20   ALT  6 - 57 U/L 24   Alk Phos (alkaline Phosphatase) 34 - 104 U/L 55   Albumin 3.5 - 4.8 g/dL 4.4   Bilirubin, Total 0.3 - 1.2 mg/dL 0.8   Protein, Total 6.1 - 7.9 g/dL 6.8   A/G Ratio 1.0 - 5.0 gm/dL 1.8   Resulting Agency  [facility] - [facility]  Specimen Collected: 07/09/21 07:14 Last Resulted: 07/09/21 13:10  Received From: [facility]  Result Received: 09/16/21 07:57   WBC (White Blood Cell Count) 4.1 - 10.2 103/uL 7.3   RBC  (Red Blood Cell Count) 4.69 - 6.13 106/uL 4.84   Hemoglobin 14.1 - 18.1 gm/dL 14.7   Hematocrit 40.0 - 52.0 % 42.1   MCV (Mean Corpuscular Volume) 80.0 - 100.0 fl 87.0   MCH (Mean Corpuscular Hemoglobin) 27.0 - 31.2 pg 30.4   MCHC (Mean Corpuscular Hemoglobin Concentration) 32.0 - 36.0 gm/dL 34.9   Platelet Count 150 - 450 103/uL 182   RDW-CV (Red Cell Distribution Width) 11.6 - 14.8 % 11.6   MPV (Mean Platelet Volume) 9.4 - 12.4 fl 9.1 Low    Neutrophils 1.50 - 7.80 103/uL 4.00   Lymphocytes 1.00 - 3.60 103/uL 2.31   Monocytes 0.00 - 1.50 103/uL 0.69   Eosinophils 0.00 - 0.55 103/uL 0.17   Basophils 0.00 - 0.09 103/uL 0.07   Neutrophil % 32.0 - 70.0 % 55.1   Lymphocyte % 10.0 - 50.0 % 31.8   Monocyte % 4.0 - 13.0 % 9.5   Eosinophil % 1.0 - 5.0 % 2.3   Basophil% 0.0 - 2.0 % 1.0   Immature Granulocyte % <=0.7 % 0.3   Immature Granulocyte Count <=0.06 10^3/L 0.02   Resulting Agency  [facility] - [facility]  Specimen Collected: 07/09/21 07:14 Last Resulted: 07/09/21 08:39  Received From: [place]  Result Received: 09/16/21 07:57   Cholesterol, Total 100 - 200 mg/dL  168   Triglyceride 35 - 199 mg/dL 213 High    HDL (High Density Lipoprotein) Cholesterol 29.0 - 71.0 mg/dL 55.2   LDL Calculated 0 - 130 mg/dL 70   VLDL Cholesterol mg/dL 43   Cholesterol/HDL Ratio  3.0   Resulting [facility] - LAB  Specimen Collected: 07/09/21 07:14 Last Resulted: 07/09/21 13:10  Received From: [place]  Result Received: 09/16/21 07:57   Color Yellow, Violet, Light Violet, Dark Violet Yellow   Clarity Clear, Other Clear   Specific Gravity 1.000 - 1.030 1.020   pH, Urine 5.0 - 8.0 6.0   Protein, Urinalysis Negative, Trace mg/dL Negative   Glucose, Urinalysis Negative mg/dL Negative   Ketones, Urinalysis Negative mg/dL Negative   Blood, Urinalysis Negative Negative   Nitrite, Urinalysis Negative Negative   Leukocyte Esterase,  Urinalysis Negative Negative   White Blood Cells, Urinalysis None Seen, 0-3 /hpf None Seen   Red Blood Cells, Urinalysis None Seen, 0-3 /hpf None Seen   Bacteria, Urinalysis None Seen /hpf None Seen   Squamous Epithelial Cells, Urinalysis Rare, Few, None Seen /hpf None Seen   Resulting Agency  [facility] - LAB  Specimen Collected: 07/09/21 07:14 Last Resulted: 07/09/21 08:39  Received From: [place]  Result Received: 09/16/21 07:57   PSA (Prostate Specific Antigen), Total 0.10 - 4.00 ng/mL 2.91   [facility] - LAB  Narrative Performed by [facility] - LAB Test results were determined with [employer] Coulter Hybritech Assay. Values obtained with different assay methods cannot be used interchangeably in serial testing. Assay results should not be interpreted as absolute evidence of the presence or absence of malignant disease Specimen Collected: 07/09/21 07:14 Last Resulted: 07/09/21 10:32  Received From: [facility]  Result Received: 09/16/21 07:57  I have reviewed the labs.  Pertinent Imaging N/A    Assessment & Plan:    1. Increase in PSA velocity - PSA is stable  2. Testosterone deficiency -discontinued TRT  3. BPH with LUTS -PSA stable -DRE benign -continue conservative management, avoiding bladder irritants and timed voiding's   4. ED - continue sildenafil 20 mg, on-demand-dosing  No follow-ups on file.  Zara Council, PA-C  [facility] [address] [place], [place] [address] [phone]

## 2021-10-15 ENCOUNTER — Encounter: Payer: Self-pay | Admitting: Urology

## 2021-10-15 ENCOUNTER — Ambulatory Visit: Payer: BC Managed Care – PPO | Admitting: Urology

## 2021-10-15 ENCOUNTER — Other Ambulatory Visit: Payer: Self-pay

## 2021-10-15 VITALS — BP 150/91 | HR 99 | Ht 68.0 in | Wt 167.0 lb

## 2021-10-15 DIAGNOSIS — N401 Enlarged prostate with lower urinary tract symptoms: Secondary | ICD-10-CM

## 2021-10-15 DIAGNOSIS — N138 Other obstructive and reflux uropathy: Secondary | ICD-10-CM | POA: Diagnosis not present

## 2021-10-15 DIAGNOSIS — N529 Male erectile dysfunction, unspecified: Secondary | ICD-10-CM | POA: Diagnosis not present

## 2022-01-09 ENCOUNTER — Other Ambulatory Visit: Payer: Self-pay

## 2022-01-09 MED ORDER — NA SULFATE-K SULFATE-MG SULF 17.5-3.13-1.6 GM/177ML PO SOLN: 1.0000 | Freq: Once | ORAL | 0 refills | Status: AC

## 2022-01-09 NOTE — Progress Notes (Signed)
Gastroenterology Pre-Procedure Review ? ?Request Date: 03/04/2022 ?Requesting Physician: Dr. Allen Norris ? ?PATIENT REVIEW QUESTIONS: The patient responded to the following health history questions as indicated:   ? ?1. Are you having any GI issues? no ?2. Do you have a personal history of Polyps? no ?3. Do you have a family history of Colon Cancer or Polyps? no ?4. Diabetes Mellitus? no ?5. Joint replacements in the past 12 months?no ?6. Major health problems in the past 3 months?no ?7. Any artificial heart valves, MVP, or defibrillator?no ?   ?MEDICATIONS & ALLERGIES:    ?Patient reports the following regarding taking any anticoagulation/antiplatelet therapy:   ?Plavix, Coumadin, Eliquis, Xarelto, Lovenox, Pradaxa, Brilinta, or Effient? no ?Aspirin? no ? ?Patient confirms/reports the following medications:  ?Current Outpatient Medications  ?Medication Sig Dispense Refill  ? amLODipine (NORVASC) 5 MG tablet Take 5 mg by mouth daily.    ? amLODipine (NORVASC) 5 MG tablet Take 1 tablet by mouth daily.    ? hydrochlorothiazide (HYDRODIURIL) 25 MG tablet Take 12.5 mg by mouth daily.    ? losartan (COZAAR) 100 MG tablet Take 100 mg by mouth daily.    ? losartan-hydrochlorothiazide (HYZAAR) 100-12.5 MG tablet Take 1 tablet by mouth daily.  0  ? rosuvastatin (CRESTOR) 40 MG tablet Take 40 mg by mouth daily.    ? sildenafil (REVATIO) 20 MG tablet TAKE THREE TO FIVE TABLETS FOR FOR ERECTILE DYSFUNCTION 2 HOURS PRIOR TO INTERCOURSE ON AN EMPTY STOMACH DO NOT TAKE WITH NITRATES 30 tablet 3  ? ?No current facility-administered medications for this visit.  ? ? ?Patient confirms/reports the following allergies:  ?Allergies  ?Allergen Reactions  ? Cardura [Doxazosin] Nausea Only  ?  Felt nausea, dizzy and tired   ? ? ?No orders of the defined types were placed in this encounter. ? ? ?AUTHORIZATION INFORMATION ?Primary Insurance: ?1D#: ?Group #: ? ?Secondary Insurance: ?1D#: ?Group #: ? ?SCHEDULE INFORMATION: ?Date:  03/04/2022 ?Time: ?Location: ?[facility] ?

## 2022-03-04 ENCOUNTER — Ambulatory Visit: Payer: BC Managed Care – PPO | Admitting: Anesthesiology

## 2022-03-04 ENCOUNTER — Ambulatory Visit
Admission: RE | Admit: 2022-03-04 | Discharge: 2022-03-04 | Disposition: A | Discharge status: Home / Self Care | Payer: BC Managed Care – PPO | Attending: Gastroenterology | Admitting: Gastroenterology

## 2022-03-04 ENCOUNTER — Encounter
Admission: RE | Disposition: A | Discharge status: Home / Self Care | Payer: Self-pay | Source: Home / Self Care | Attending: Gastroenterology

## 2022-03-04 ENCOUNTER — Encounter: Payer: Self-pay | Admitting: Gastroenterology

## 2022-03-04 DIAGNOSIS — D128 Benign neoplasm of rectum: Secondary | ICD-10-CM | POA: Insufficient documentation

## 2022-03-04 DIAGNOSIS — Z1211 Encounter for screening for malignant neoplasm of colon: Secondary | ICD-10-CM | POA: Diagnosis not present

## 2022-03-04 DIAGNOSIS — K621 Rectal polyp: Secondary | ICD-10-CM | POA: Diagnosis not present

## 2022-03-04 DIAGNOSIS — I10 Essential (primary) hypertension: Secondary | ICD-10-CM | POA: Diagnosis not present

## 2022-03-04 DIAGNOSIS — D125 Benign neoplasm of sigmoid colon: Secondary | ICD-10-CM | POA: Insufficient documentation

## 2022-03-04 DIAGNOSIS — Z87891 Personal history of nicotine dependence: Secondary | ICD-10-CM | POA: Diagnosis not present

## 2022-03-04 DIAGNOSIS — K635 Polyp of colon: Secondary | ICD-10-CM

## 2022-03-04 HISTORY — PX: COLONOSCOPY WITH PROPOFOL: SHX5780

## 2022-03-04 SURGERY — COLONOSCOPY WITH PROPOFOL
Anesthesia: General

## 2022-03-04 MED ORDER — PROPOFOL 10 MG/ML IV BOLUS
INTRAVENOUS | Status: AC
  Filled 2022-03-04: qty 40

## 2022-03-04 MED ORDER — LIDOCAINE HCL (PF) 2 % IJ SOLN
INTRAMUSCULAR | Status: AC
  Filled 2022-03-04: qty 5

## 2022-03-04 MED ORDER — LIDOCAINE HCL (CARDIAC) PF 100 MG/5ML IV SOSY
PREFILLED_SYRINGE | INTRAVENOUS | Status: DC | PRN
  Administered 2022-03-04: 100 mg via INTRAVENOUS

## 2022-03-04 MED ORDER — SODIUM CHLORIDE 0.9 % IV SOLN
INTRAVENOUS | Status: DC
  Administered 2022-03-04: 1000 mL via INTRAVENOUS

## 2022-03-04 MED ORDER — PROPOFOL 10 MG/ML IV BOLUS
INTRAVENOUS | Status: DC | PRN
  Administered 2022-03-04: 180 ug/kg/min via INTRAVENOUS
  Administered 2022-03-04: 120 mg via INTRAVENOUS

## 2022-03-04 NOTE — Op Note (Signed)
[facility] ?Gastroenterology ?Patient Name: Benjamin Mitchell ?Procedure Date: 03/04/2022 9:47 AM ?MRN: [id number] ?Account #: [id number] ?Date of Birth: 04/29/1971 ?Admit Type: Outpatient ?Age: 51 ?Room: [facility] ENDO ROOM 4 ?Gender: Male ?Note Status: Finalized ?Instrument Name: Colonscope [id number] ?Procedure:             Colonoscopy ?Indications:           Screening for colorectal malignant neoplasm ?Providers:             Lucilla Lame MD, MD ?Medicines:             Propofol per Anesthesia ?Complications:         No immediate complications. ?Procedure:             Pre-Anesthesia Assessment: ?                       - Prior to the procedure, a History and Physical was  ?                       performed, and patient medications and allergies were  ?                       reviewed. The patient's tolerance of previous  ?                       anesthesia was also reviewed. The risks and benefits  ?                       of the procedure and the sedation options and risks  ?                       were discussed with the patient. All questions were  ?                       answered, and informed consent was obtained. Prior  ?                       Anticoagulants: The patient has taken no previous  ?                       anticoagulant or antiplatelet agents. ASA Grade  ?                       Assessment: II - A patient with mild systemic disease.  ?                       After reviewing the risks and benefits, the patient  ?                       was deemed in satisfactory condition to undergo the  ?                       procedure. ?                       After obtaining informed consent, the colonoscope was  ?                       passed under direct vision. Throughout the procedure,  ?  the patient's blood pressure, pulse, and oxygen  ?                       saturations were monitored continuously. The  ?                       Colonoscope was introduced through the anus and  ?                        advanced to the the cecum, identified by appendiceal  ?                       orifice and ileocecal valve. The colonoscopy was  ?                       performed without difficulty. The patient tolerated  ?                       the procedure well. The quality of the bowel  ?                       preparation was excellent. ?Findings: ?     The perianal and digital rectal examinations were normal. ?     Two sessile polyps were found in the rectum. The polyps were 3 to 6 mm  ?     in size. These polyps were removed with a cold snare. Resection and  ?     retrieval were complete. ?     A 4 mm polyp was found in the sigmoid colon. The polyp was sessile. The  ?     polyp was removed with a cold biopsy forceps. Resection and retrieval  ?     were complete. ?     The exam was otherwise without abnormality. ?Impression:            - Two 3 to 6 mm polyps in the rectum, removed with a  ?                       cold snare. Resected and retrieved. ?                       - One 4 mm polyp in the sigmoid colon, removed with a  ?                       cold biopsy forceps. Resected and retrieved. ?                       - The examination was otherwise normal. ?Recommendation:        - Discharge patient to home. ?                       - Resume previous diet. ?                       - Continue present medications. ?                       - Await pathology results. ?                       -  If the pathology report reveals adenomatous tissue,  ?                       then repeat the colonoscopy for surveillance in 7  ?                       years otherwise 10 year. ?Procedure Code(s):     --- Professional --- ?                       [phone], Colonoscopy, flexible; with removal of  ?                       tumor(s), polyp(s), or other lesion(s) by snare  ?                       technique ?                       45380, 59, Colonoscopy, flexible; with biopsy, single  ?                       or multiple ?Diagnosis Code(s):     ---  Professional --- ?                       Z12.11, Encounter for screening for malignant neoplasm  ?                       of colon ?                       K62.1, Rectal polyp ?CPT copyright 2019 American Medical Association. All rights reserved. ?The codes documented in this report are preliminary and upon coder review may  ?be revised to meet current compliance requirements. ?Lucilla Lame MD, MD ?03/04/2022 10:10:36 AM ?This report has been signed electronically. ?Number of Addenda: 0 ?Note Initiated On: 03/04/2022 9:47 AM ?Scope Withdrawal Time: 0 hours 8 minutes 4 seconds  ?Total Procedure Duration: 0 hours 10 minutes 26 seconds  ?Estimated Blood Loss:  Estimated blood loss: none. ?     [facility] ?

## 2022-03-04 NOTE — Transfer of Care (Signed)
Immediate Anesthesia Transfer of Care Note ? ?Patient: Benjamin Mitchell ? ?Procedure(s) Performed: COLONOSCOPY WITH PROPOFOL ? ?Patient Location: PACU ? ?Anesthesia Type:General ? ?Level of Consciousness: drowsy ? ?Airway & Oxygen Therapy: Patient Spontanous Breathing ? ?Post-op Assessment: Report given to RN and Post -op Vital signs reviewed and stable ? ?Post vital signs: Reviewed and stable ? ?Last Vitals:  ?Vitals Value Taken Time  ?BP 103/73 03/04/22 1014  ?Temp 36.4 ?C 03/04/22 1014  ?Pulse 58 03/04/22 1015  ?Resp 17 03/04/22 1015  ?SpO2 98 % 03/04/22 1015  ?Vitals shown include unvalidated device data. ? ?Last Pain:  ?Vitals:  ? 03/04/22 1014  ?TempSrc: Temporal  ?PainSc: 0-No pain  ?   ? ?  ? ?Complications: No notable events documented. ?

## 2022-03-04 NOTE — H&P (Signed)
? ?Lucilla Lame, MD [facility] ?[address] ?[place], [place] [address] ?Phone: [phone] ?Fax : [phone] ? ?Primary Care Physician:  Idelle Crouch, MD ?Primary Gastroenterologist:  Dr. Allen Norris ? ?Pre-Procedure History & Physical: ?HPI:  Benjamin Mitchell is a 51 y.o. male is here for a screening colonoscopy.  ? ?Past Medical History:  ?Diagnosis Date  ? BPH with obstruction/lower urinary tract symptoms   ? HLD (hyperlipidemia)   ? HTN (hypertension)   ? Hypogonadism in male   ? ? ?Past Surgical History:  ?Procedure Laterality Date  ? FOOT FRACTURE SURGERY Bilateral   ? FRACTURE SURGERY    ? ? ?Prior to Admission medications   ?Medication Sig Start Date End Date Taking? Authorizing Provider  ?amLODipine (NORVASC) 5 MG tablet Take 5 mg by mouth daily. 05/14/20  Yes [provider]  ?hydrochlorothiazide (HYDRODIURIL) 25 MG tablet Take 12.5 mg by mouth daily. 05/10/20  Yes [provider]  ?losartan (COZAAR) 100 MG tablet Take 100 mg by mouth daily. 05/06/20  Yes [provider]  ?losartan-hydrochlorothiazide (HYZAAR) 100-12.5 MG tablet Take 1 tablet by mouth daily. 01/30/16  Yes [provider]  ?rosuvastatin (CRESTOR) 40 MG tablet Take 40 mg by mouth daily.   Yes [provider]  ?amLODipine (NORVASC) 5 MG tablet Take 1 tablet by mouth daily. 11/10/21   [provider]  ?sildenafil (REVATIO) 20 MG tablet TAKE THREE TO FIVE TABLETS FOR FOR ERECTILE DYSFUNCTION 2 HOURS PRIOR TO INTERCOURSE ON AN EMPTY STOMACH DO NOT TAKE WITH NITRATES 09/16/21   Zara Council A, PA-C  ? ? ?Allergies as of 01/09/2022 - Review Complete 01/09/2022  ?Allergen Reaction Noted  ? Cardura [doxazosin] Nausea Only 12/19/2020  ? ? ?Family History  ?Problem Relation Age of Onset  ? Stroke Father   ? Breast cancer Mother   ? Kidney disease Neg Hx   ? Prostate cancer Neg Hx   ? ? ?Social History  ? ?Socioeconomic History  ? Marital status: Married  ?  Spouse name: Not on file  ? Number  of children: Not on file  ? Years of education: Not on file  ? Highest education level: Not on file  ?Occupational History  ? Not on file  ?Tobacco Use  ? Smoking status: Former  ? Smokeless tobacco: Former  ? Tobacco comments:  ?  quit 20 years ago  ?Vaping Use  ? Vaping Use: Never used  ?Substance and Sexual Activity  ? Alcohol use: Yes  ?  Alcohol/week: 0.0 standard drinks  ?  Comment: heavy  ? Drug use: No  ? Sexual activity: Not on file  ?Other Topics Concern  ? Not on file  ?Social History Narrative  ? Not on file  ? ?Social Determinants of Health  ? ?Financial Resource Strain: Not on file  ?Food Insecurity: Not on file  ?Transportation Needs: Not on file  ?Physical Activity: Not on file  ?Stress: Not on file  ?Social Connections: Not on file  ?Intimate Partner Violence: Not on file  ? ? ?Review of Systems: ?See HPI, otherwise negative ROS ? ?Physical Exam: ?BP (!) 149/106   Pulse 64   Temp (!) 97.4 ?F (36.3 ?C) (Temporal)   Resp 18   Ht '5\' 8"'$  (1.727 m)   Wt 72.7 kg   SpO2 100%   BMI 24.38 kg/m?  ?General:   Alert,  pleasant and cooperative in NAD ?Head:  Normocephalic and atraumatic. ?Neck:  Supple; no masses or thyromegaly. ?Lungs:  Clear  throughout to auscultation.    ?Heart:  Regular rate and rhythm. ?Abdomen:  Soft, nontender and nondistended. Normal bowel sounds, without guarding, and without rebound.   ?Neurologic:  Alert and  oriented x4;  grossly normal neurologically. ? ?Impression/Plan: ?Benjamin Mitchell is now here to undergo a screening colonoscopy. ? ?Risks, benefits, and alternatives regarding colonoscopy have been reviewed with the patient.  Questions have been answered.  All parties agreeable. ?

## 2022-03-04 NOTE — Anesthesia Preprocedure Evaluation (Signed)
Anesthesia Evaluation  ?Patient identified by MRN, date of birth, ID band ?Patient awake ? ? ? ?Reviewed: ?Allergy & Precautions, H&P , NPO status , Patient's Chart, lab work & pertinent test results, reviewed documented beta blocker date and time  ? ?Airway ?Mallampati: II ? ? ?Neck ROM: full ? ? ? Dental ? ?(+) Poor Dentition ?  ?Pulmonary ?neg pulmonary ROS, former smoker,  ?  ?Pulmonary exam normal ? ? ? ? ? ? ? Cardiovascular ?Exercise Tolerance: Good ?hypertension, On Medications ?negative cardio ROS ?Normal cardiovascular exam ?Rhythm:regular Rate:Normal ? ? ?  ?Neuro/Psych ?negative neurological ROS ? negative psych ROS  ? GI/Hepatic ?negative GI ROS, Neg liver ROS,   ?Endo/Other  ?negative endocrine ROS ? Renal/GU ?negative Renal ROS  ?negative genitourinary ?  ?Musculoskeletal ? ? Abdominal ?  ?Peds ? Hematology ?negative hematology ROS ?(+)   ?Anesthesia Other Findings ?Past Medical History: ?No date: BPH with obstruction/lower urinary tract symptoms ?No date: HLD (hyperlipidemia) ?No date: HTN (hypertension) ?No date: Hypogonadism in male ?Past Surgical History: ?No date: FOOT FRACTURE SURGERY; Bilateral ?No date: FRACTURE SURGERY ?BMI   ? Body Mass Index: 24.38 kg/m?  ?  ? Reproductive/Obstetrics ?negative OB ROS ? ?  ? ? ? ? ? ? ? ? ? ? ? ? ? ?  ?  ? ? ? ? ? ? ? ? ?Anesthesia Physical ?Anesthesia Plan ? ?ASA: 2 ? ?Anesthesia Plan: General  ? ?Post-op Pain Management:   ? ?Induction:  ? ?PONV Risk Score and Plan:  ? ?Airway Management Planned:  ? ?Additional Equipment:  ? ?Intra-op Plan:  ? ?Post-operative Plan:  ? ?Informed Consent: I have reviewed the patients History and Physical, chart, labs and discussed the procedure including the risks, benefits and alternatives for the proposed anesthesia with the patient or authorized representative who has indicated his/her understanding and acceptance.  ? ? ? ?Dental Advisory Given ? ?Plan Discussed with: CRNA ? ?Anesthesia  Plan Comments:   ? ? ? ? ? ? ?Anesthesia Quick Evaluation ? ?

## 2022-03-04 NOTE — Anesthesia Postprocedure Evaluation (Signed)
Anesthesia Post Note ? ?Patient: Benjamin Mitchell ? ?Procedure(s) Performed: COLONOSCOPY WITH PROPOFOL ? ?Patient location during evaluation: PACU ?Anesthesia Type: General ?Level of consciousness: awake and alert ?Pain management: pain level controlled ?Vital Signs Assessment: post-procedure vital signs reviewed and stable ?Respiratory status: spontaneous breathing, nonlabored ventilation, respiratory function stable and patient connected to nasal cannula oxygen ?Cardiovascular status: blood pressure returned to baseline and stable ?Postop Assessment: no apparent nausea or vomiting ?Anesthetic complications: no ? ? ?No notable events documented. ? ? ?Last Vitals:  ?Vitals:  ? 03/04/22 1014 03/04/22 1024  ?BP: 103/73 119/83  ?Pulse: (!) 58 62  ?Resp: 17 16  ?Temp: (!) 36.4 ?C   ?SpO2: 98% 100%  ?  ?Last Pain:  ?Vitals:  ? 03/04/22 1024  ?TempSrc:   ?PainSc: 0-No pain  ? ? ?  ?  ?  ?  ?  ?  ? ?Molli Barrows ? ? ? ? ?

## 2022-03-05 ENCOUNTER — Encounter: Payer: Self-pay | Admitting: Gastroenterology

## 2022-03-05 LAB — SURGICAL PATHOLOGY

## 2022-07-19 ENCOUNTER — Other Ambulatory Visit: Payer: Self-pay | Admitting: Urology

## 2022-10-09 ENCOUNTER — Other Ambulatory Visit: Payer: Self-pay | Admitting: *Deleted

## 2022-10-09 DIAGNOSIS — R972 Elevated prostate specific antigen [PSA]: Secondary | ICD-10-CM

## 2022-10-09 DIAGNOSIS — N138 Other obstructive and reflux uropathy: Secondary | ICD-10-CM

## 2022-10-10 ENCOUNTER — Other Ambulatory Visit: Payer: BC Managed Care – PPO

## 2022-10-10 DIAGNOSIS — R972 Elevated prostate specific antigen [PSA]: Secondary | ICD-10-CM

## 2022-10-10 DIAGNOSIS — N401 Enlarged prostate with lower urinary tract symptoms: Secondary | ICD-10-CM

## 2022-10-11 LAB — PSA: Prostate Specific Ag, Serum: 2.6 ng/mL (ref 0.0–4.0)

## 2022-10-14 NOTE — Progress Notes (Signed)
9:51 PM   Benjamin Mitchell May 19, 1971 [id number]  Referring provider: Idelle Crouch, MD [facility] [place],  [place] [address]  Urological history: 1. Prostate cancer screening -PSA (09/2022) 2.6 -PSA (06/2022) 3.10 -Prostate MRI 05/2020 iPSA 3.3  No findings of high-grade or macroscopic prostate carcinoma.  Heterogeneous T2 signal and early post-contrast enhancement throughout the peripheral zone, most significant in the apex.  Nonspecific, but can be seen with prostatitis.  Prostate volume 23 cc.  PSAD 0.143  2. Testosterone deficiency -Discontinued therapy due to rising PSA  3. BPH with LU TS - I PSS ***  4. ED - contributing factors of former smoker, age, BPH, testosterone deficiency, hyperlipidemia and HTN -SHIM *** - managed with sildenafil 20 mg, on-demand-dosing  No chief complaint on file.   HPI: Benjamin Mitchell is a 51 y.o. male who presents today for 12 months follow up.        Score:  1-7 Mild 8-19 Moderate 20-35 Severe        Score: 1-7 Severe ED 8-11 Moderate ED 12-16 Mild-Moderate ED 17-21 Mild ED 22-25 No ED  PMH: Past Medical History:  Diagnosis Date   BPH with obstruction/lower urinary tract symptoms    HLD (hyperlipidemia)    HTN (hypertension)    Hypogonadism in male     Surgical History: Past Surgical History:  Procedure Laterality Date   COLONOSCOPY WITH PROPOFOL N/A 03/04/2022   Procedure: COLONOSCOPY WITH PROPOFOL;  Surgeon: Lucilla Lame, MD;  Location: [facility] ENDOSCOPY;  Service: Endoscopy;  Laterality: N/A;   FOOT FRACTURE SURGERY Bilateral    FRACTURE SURGERY      Home Medications:  Allergies as of 10/15/2022       Reactions   Cardura [doxazosin] Nausea Only   Felt nausea, dizzy and tired         Medication List        Accurate as of October 14, 2022  9:51 PM. If you have any questions, ask your nurse or doctor.          amLODipine 5 MG tablet Commonly known as:  NORVASC Take 5 mg by mouth daily.   amLODipine 5 MG tablet Commonly known as: NORVASC Take 1 tablet by mouth daily.   hydrochlorothiazide 25 MG tablet Commonly known as: HYDRODIURIL Take 12.5 mg by mouth daily.   losartan 100 MG tablet Commonly known as: COZAAR Take 100 mg by mouth daily.   losartan-hydrochlorothiazide 100-12.5 MG tablet Commonly known as: HYZAAR Take 1 tablet by mouth daily.   rosuvastatin 40 MG tablet Commonly known as: CRESTOR Take 40 mg by mouth daily.   sildenafil 20 MG tablet Commonly known as: REVATIO TAKE 3-5 TABLETS BY MOUTH ON AN EMPTY STOMACH TWO HOURS PRIOR TO INTERCOURSE FOR ERECTILE DYSFUNCTION.  DO NOT TAKE WITH NITRATES        Allergies:  Allergies  Allergen Reactions   Cardura [Doxazosin] Nausea Only    Felt nausea, dizzy and tired     Family History: Family History  Problem Relation Age of Onset   Stroke Father    Breast cancer Mother    Kidney disease Neg Hx    Prostate cancer Neg Hx     Social History:  reports that he has quit smoking. He has quit using smokeless tobacco. He reports current alcohol use. He reports that he does not use drugs.  ROS: For pertinent review of systems please refer to history of present illness  Physical Exam:  There were no vitals taken for this visit.  Constitutional:  Well nourished. Alert and oriented, No acute distress. HEENT: [place] AT, moist mucus membranes.  Trachea midline Cardiovascular: No clubbing, cyanosis, or edema. Respiratory: Normal respiratory effort, no increased work of breathing. GU: No CVA tenderness.  No bladder fullness or masses.  Patient with circumcised/uncircumcised phallus. ***Foreskin easily retracted***  Urethral meatus is patent.  No penile discharge. No penile lesions or rashes. Scrotum without lesions, cysts, rashes and/or edema.  Testicles are located scrotally bilaterally. No masses are appreciated in the testicles. Left and right epididymis are normal. Rectal:  Patient with  normal sphincter tone. Anus and perineum without scarring or rashes. No rectal masses are appreciated. Prostate is approximately *** grams, *** nodules are appreciated. Seminal vesicles are normal. Neurologic: Grossly intact, no focal deficits, moving all 4 extremities. Psychiatric: Normal mood and affect.   Laboratory Data: Results for orders placed or performed in visit on 10/10/22  PSA  Result Value Ref Range   Prostate Specific Ag, Serum 2.6 0.0 - 4.0 ng/mL    Cholesterol, Total 100 - 200 mg/dL 187  Triglyceride 35 - 199 mg/dL 159  HDL (High Density Lipoprotein) Cholesterol 29.0 - 71.0 mg/dL 56.1  LDL Calculated 0 - 130 mg/dL 99  VLDL Cholesterol mg/dL 32  Cholesterol/HDL Ratio  3.3  Resulting [place] - LAB   Specimen Collected: 07/11/22 09:42   Performed by: [facility]: 07/11/22 13:04  Received From: [facility]  Result Received: 10/14/22 21:51   Glucose 70 - 110 mg/dL 92  Sodium 136 - 145 mmol/L 137  Potassium 3.6 - 5.1 mmol/L 4.0  Chloride 97 - 109 mmol/L 100  Carbon Dioxide (CO2) 22.0 - 32.0 mmol/L 32.6 High   Urea Nitrogen (BUN) 7 - 25 mg/dL 20  Creatinine 0.7 - 1.3 mg/dL 1.0  Glomerular Filtration Rate (eGFR) >60 mL/min/1.73sq m 79  Calcium 8.7 - 10.3 mg/dL 9.5  AST 8 - 39 U/L 20  ALT 6 - 57 U/L 22  Alk Phos (alkaline Phosphatase) 34 - 104 U/L 48  Albumin 3.5 - 4.8 g/dL 4.6  Bilirubin, Total 0.3 - 1.2 mg/dL 0.8  Protein, Total 6.1 - 7.9 g/dL 7.1  A/G Ratio 1.0 - 5.0 gm/dL 1.8  Resulting Agency  [place] - LAB   Specimen Collected: 07/11/22 09:42   Performed by: [place]: 07/11/22 13:04  Received From: [facility]  Result Received: 10/14/22 21:51  I have reviewed the labs.  Pertinent Imaging N/A    Assessment & Plan:    1. Testosterone deficiency -discontinued TRT  2. BPH with LUTS -PSA stable -DRE  benign -continue conservative management, avoiding bladder irritants and timed voiding's   3.  ED - continue sildenafil 20 mg, on-demand-dosing  No follow-ups on file.  [place], [place] [address] [facility] [place], [place] [address] [phone]

## 2022-10-15 ENCOUNTER — Encounter: Payer: Self-pay | Admitting: Urology

## 2022-10-15 ENCOUNTER — Ambulatory Visit: Payer: BC Managed Care – PPO | Admitting: Urology

## 2022-10-15 VITALS — BP 117/80 | HR 83 | Ht 68.0 in | Wt 166.0 lb

## 2022-10-15 DIAGNOSIS — N138 Other obstructive and reflux uropathy: Secondary | ICD-10-CM

## 2022-10-15 DIAGNOSIS — E349 Endocrine disorder, unspecified: Secondary | ICD-10-CM

## 2022-10-15 DIAGNOSIS — N529 Male erectile dysfunction, unspecified: Secondary | ICD-10-CM | POA: Diagnosis not present

## 2022-10-15 DIAGNOSIS — N401 Enlarged prostate with lower urinary tract symptoms: Secondary | ICD-10-CM

## 2023-09-05 ENCOUNTER — Other Ambulatory Visit: Payer: Self-pay | Admitting: Urology

## 2023-10-27 ENCOUNTER — Ambulatory Visit: Payer: BC Managed Care – PPO | Admitting: Urology

## 2023-11-03 ENCOUNTER — Ambulatory Visit: Payer: BC Managed Care – PPO | Admitting: Urology

## 2023-11-05 ENCOUNTER — Ambulatory Visit: Payer: BC Managed Care – PPO | Admitting: Urology

## 2023-11-06 NOTE — Progress Notes (Signed)
 4:27 PM   Benjamin Mitchell 10/20/1971 [id number]  Referring provider: Auston Reyes BIRCH, MD [address] [facility] [place],  [place] [address]  Urological history: 1. Prostate cancer screening -PSA pending -PSA (09/2022) 2.6 -PSA (06/2022) 3.10 -Prostate MRI 05/2020 iPSA 3.3  No findings of high-grade or macroscopic prostate carcinoma.  Heterogeneous T2 signal and early post-contrast enhancement throughout the peripheral zone, most significant in the apex.  Nonspecific, but can be seen with prostatitis.  Prostate volume 23 cc.  PSAD 0.143  2. Testosterone  deficiency -Discontinued therapy due to rising PSA  3. BPH with LU TS  4. ED - contributing factors of former smoker, age, BPH, testosterone  deficiency, hyperlipidemia and HTN - managed with sildenafil  20 mg, on-demand-dosing  Chief Complaint  Patient presents with   Follow-up    1 year follow-up    HPI: Benjamin Mitchell is a 53 y.o. male who presents today for 12 months follow up.    Previous records reviewed.     I PSS 3/0  He has no urinary complaints.  Patient denies any modifying or aggravating factors.  Patient denies any recent UTI's, gross hematuria, dysuria or suprapubic/flank pain.  Patient denies any fevers, chills, nausea or vomiting.      IPSS     Row Name 11/10/23 1500         International Prostate Symptom Score   How often have you had the sensation of not emptying your bladder? Not at All     How often have you had to urinate less than every two hours? Less than 1 in 5 times     How often have you found you stopped and started again several times when you urinated? Not at All     How often have you found it difficult to postpone urination? Not at All     How often have you had a weak urinary stream? Less than 1 in 5 times     How often have you had to strain to start urination? Not at All     How many times did you typically get up at night to urinate? 1 Time     Total IPSS  Score 3       Quality of Life due to urinary symptoms   If you were to spend the rest of your life with your urinary condition just the way it is now how would you feel about that? Delighted                 Score:  1-7 Mild 8-19 Moderate 20-35 Severe  SHIM 23  Patient still having spontaneous erections.  He denies any pain or curvature with erections.  He is having good results with sildenafil  20 mg on-demand dosing.      SHIM     Row Name 11/10/23 1549         SHIM: Over the last 6 months:   How do you rate your confidence that you could get and keep an erection? High     When you had erections with sexual stimulation, how often were your erections hard enough for penetration (entering your partner)? Most Times (much more than half the time)     During sexual intercourse, how often were you able to maintain your erection after you had penetrated (entered) your partner? Almost Always or Always     During sexual intercourse, how difficult was it to maintain your erection to completion of intercourse? Not  Difficult     When you attempted sexual intercourse, how often was it satisfactory for you? Almost Always or Always       SHIM Total Score   SHIM 23                 Score: 1-7 Severe ED 8-11 Moderate ED 12-16 Mild-Moderate ED 17-21 Mild ED 22-25 No ED  PMH: Past Medical History:  Diagnosis Date   BPH with obstruction/lower urinary tract symptoms    HLD (hyperlipidemia)    HTN (hypertension)    Hypogonadism in male     Surgical History: Past Surgical History:  Procedure Laterality Date   COLONOSCOPY WITH PROPOFOL  N/A 03/04/2022   Procedure: COLONOSCOPY WITH PROPOFOL ;  Surgeon: Jinny Carmine, MD;  Location: [facility] ENDOSCOPY;  Service: Endoscopy;  Laterality: N/A;   FOOT FRACTURE SURGERY Bilateral    FRACTURE SURGERY      Home Medications:  Allergies as of 11/10/2023       Reactions   Cardura [doxazosin] Nausea Only   Felt nausea, dizzy and tired          Medication List        Accurate as of November 10, 2023  4:27 PM. If you have any questions, ask your nurse or doctor.          amLODipine 5 MG tablet Commonly known as: NORVASC Take 1 tablet by mouth daily.   hydrochlorothiazide 25 MG tablet Commonly known as: HYDRODIURIL Take 12.5 mg by mouth daily.   losartan 100 MG tablet Commonly known as: COZAAR Take 100 mg by mouth daily.   losartan-hydrochlorothiazide 100-12.5 MG tablet Commonly known as: HYZAAR Take 1 tablet by mouth daily.   rosuvastatin 40 MG tablet Commonly known as: CRESTOR Take 40 mg by mouth daily.   sildenafil  20 MG tablet Commonly known as: REVATIO  TAKE 3-5 TABLETS BY MOUTH ON AND EMPTY STOMACH TWO HOURS PRIOR TO INTERCOURSE  DO NOT TAKE WITH NITRATES        Allergies:  Allergies  Allergen Reactions   Cardura [Doxazosin] Nausea Only    Felt nausea, dizzy and tired     Family History: Family History  Problem Relation Age of Onset   Stroke Father    Breast cancer Mother    Kidney disease Neg Hx    Prostate cancer Neg Hx     Social History:  reports that he has quit smoking. He has been exposed to tobacco smoke. He has quit using smokeless tobacco. He reports current alcohol use. He reports that he does not use drugs.  ROS: For pertinent review of systems please refer to history of present illness  Physical Exam: BP 125/82   Pulse 87   Ht 5' 8 (1.727 m)   Wt 165 lb (74.8 kg)   BMI 25.09 kg/m   Constitutional:  Well nourished. Alert and oriented, No acute distress. HEENT: [place] AT, moist mucus membranes.  Trachea midline, no masses. Cardiovascular: No clubbing, cyanosis, or edema. Respiratory: Normal respiratory effort, no increased work of breathing. GU: No CVA tenderness.  No bladder fullness or masses.  Patient with circumcised phallus.  Urethral meatus is patent.  No penile discharge. No penile lesions or rashes. Scrotum without lesions, cysts, rashes and/or edema.   Testicles are located scrotally bilaterally. No masses are appreciated in the testicles. Left and right epididymis are normal. Rectal: Patient with  normal sphincter tone. Anus and perineum without scarring or rashes. No rectal masses are appreciated. Prostate is approximately 50 grams,  was not able to palpate the entire gland, no nodules are appreciated. Seminal vesicles could not be palpated.   Neurologic: Grossly intact, no focal deficits, moving all 4 extremities. Psychiatric: Normal mood and affect.   Laboratory Data: CBC w/auto Differential (5 Part) Order: [id number] Component Ref Range & Units 3 mo ago  WBC (White Blood Cell Count) 4.1 - 10.2 10^3/uL 7  RBC (Red Blood Cell Count) 4.69 - 6.13 10^6/uL 4.46 Low   Hemoglobin 14.1 - 18.1 gm/dL 85.8  Hematocrit 59.9 - 52.0 % 40  MCV (Mean Corpuscular Volume) 80.0 - 100.0 fl 89.7  MCH (Mean Corpuscular Hemoglobin) 27.0 - 31.2 pg 31.6 High   MCHC (Mean Corpuscular Hemoglobin Concentration) 32.0 - 36.0 gm/dL 64.6  Platelet Count 849 - 450 10^3/uL 187  RDW-CV (Red Cell Distribution Width) 11.6 - 14.8 % 11.5 Low   MPV (Mean Platelet Volume) 9.4 - 12.4 fl 8.7 Low   Neutrophils 1.50 - 7.80 10^3/uL 4.32  Lymphocytes 1.00 - 3.60 10^3/uL 1.75  Monocytes 0.00 - 1.50 10^3/uL 0.67  Eosinophils 0.00 - 0.55 10^3/uL 0.15  Basophils 0.00 - 0.09 10^3/uL 0.05  Neutrophil % 32.0 - 70.0 % 62  Lymphocyte % 10.0 - 50.0 % 25.1  Monocyte % 4.0 - 13.0 % 9.6  Eosinophil % 1.0 - 5.0 % 2.2  Basophil% 0.0 - 2.0 % 0.7  Immature Granulocyte % <=0.7 % 0.4  Immature Granulocyte Count <=0.06 10^3/L 0.03  Resulting Agency [facility]   Specimen Collected: 07/14/23 08:27   Performed by: [facility] Last Resulted: 07/14/23 11:21  Received From: [facility]  Result Received: 09/07/23 08:36   Comprehensive Metabolic Panel (CMP) Order: [id number] Component Ref Range & Units 3 mo ago  Glucose 70  - 110 mg/dL 97  Sodium 863 - 854 mmol/L 137  Potassium 3.6 - 5.1 mmol/L 3.6  Chloride 97 - 109 mmol/L 98  Carbon Dioxide (CO2) 22.0 - 32.0 mmol/L 30.4  Urea Nitrogen (BUN) 7 - 25 mg/dL 16  Creatinine 0.7 - 1.3 mg/dL 1  Glomerular Filtration Rate (eGFR) >60 mL/min/1.73sq m 91  Comment: CKD-EPI (2021) does not include patient's race in the calculation of eGFR.  Monitoring changes of plasma creatinine and eGFR over time is useful for monitoring kidney function.  Interpretive Ranges for eGFR (CKD-EPI 2021):  eGFR:       >60 mL/min/1.73 sq. m - Normal eGFR:       30-59 mL/min/1.73 sq. m - Moderately Decreased eGFR:       15-29 mL/min/1.73 sq. m  - Severely Decreased eGFR:       < 15 mL/min/1.73 sq. m  - Kidney Failure   Note: These eGFR calculations do not apply in acute situations when eGFR is changing rapidly or patients on dialysis.  Calcium 8.7 - 10.3 mg/dL 9.3  AST 8 - 39 U/L 21  ALT 6 - 57 U/L 25  Alk Phos (alkaline Phosphatase) 34 - 104 U/L 44  Albumin 3.5 - 4.8 g/dL 4.6  Bilirubin, Total 0.3 - 1.2 mg/dL 1  Protein, Total 6.1 - 7.9 g/dL 7.1  A/G Ratio 1.0 - 5.0 gm/dL 1.8  Resulting Agency [facility]   Specimen Collected: 07/14/23 08:27   Performed by: [facility] Last Resulted: 07/14/23 12:31  Received From: [facility]  Result Received: 09/07/23 08:36   Lipid Panel w/calc LDL Order: [id number] Component Ref Range & Units 3 mo ago  Cholesterol,  Total 100 - 200 mg/dL 803  Triglyceride 35 - 199 mg/dL 757 High   HDL (High Density Lipoprotein) Cholesterol 29.0 - 71.0 mg/dL 32.8  LDL Calculated 0 - 130 mg/dL 81  VLDL Cholesterol mg/dL 48  Cholesterol/HDL Ratio 2.9  Resulting Agency [facility]   Specimen Collected: 07/14/23 08:27   Performed by: [facility] Last Resulted: 07/14/23 12:33  Received From: [facility]  Result Received: 09/07/23 08:36    Urinalysis w/Microscopic Order: [id number] Component Ref Range & Units 3 mo ago  Color Colorless, Straw, Light Yellow, Yellow, Dark Yellow Colorless  Clarity Clear Clear  Specific Gravity 1.005 - 1.030 1.013  pH, Urine 5.0 - 8.0 6.5  Protein, Urinalysis Negative mg/dL Negative  Glucose, Urinalysis Negative mg/dL Negative  Ketones, Urinalysis Negative mg/dL Negative  Blood, Urinalysis Negative Negative  Nitrite, Urinalysis Negative Negative  Leukocyte Esterase, Urinalysis Negative Negative  Bilirubin, Urinalysis Negative Negative  Urobilinogen, Urinalysis 0.2 - 1.0 mg/dL 0.2  WBC, UA <=5 /hpf <1  Red Blood Cells, Urinalysis <=3 /hpf 1  Bacteria, Urinalysis 0 - 5 /hpf 0-5  Squamous Epithelial Cells, Urinalysis /hpf 0  Resulting Agency [facility]   Specimen Collected: 07/14/23 08:27   Performed by: [facility] Last Resulted: 07/14/23 08:55  Received From: [facility]  Result Received: 09/07/23 08:36  I have reviewed the labs.  Pertinent Imaging N/A    Assessment & Plan:    1. BPH with LUTS -PSA pending  -DRE benign -continue conservative management, avoiding bladder irritants and timed voiding's   2.  ED - sildenafil  20 mg, on-demand-dosing  Return in 1 year (on 11/09/2024) for follow up in one year for psa, ipss, shim.  CLOTILDA HELON RIGGERS  [facility] [address] [place], [place] [address] [phone]

## 2023-11-10 ENCOUNTER — Ambulatory Visit: Payer: Commercial Managed Care - PPO | Admitting: Urology

## 2023-11-10 ENCOUNTER — Encounter: Payer: Self-pay | Admitting: Urology

## 2023-11-10 VITALS — BP 125/82 | HR 87 | Ht 68.0 in | Wt 165.0 lb

## 2023-11-10 DIAGNOSIS — N529 Male erectile dysfunction, unspecified: Secondary | ICD-10-CM

## 2023-11-10 DIAGNOSIS — N401 Enlarged prostate with lower urinary tract symptoms: Secondary | ICD-10-CM

## 2023-11-10 DIAGNOSIS — N138 Other obstructive and reflux uropathy: Secondary | ICD-10-CM

## 2023-11-10 MED ORDER — SILDENAFIL CITRATE 20 MG PO TABS: ORAL_TABLET | ORAL | 1 refills | Status: DC

## 2023-11-11 ENCOUNTER — Other Ambulatory Visit: Payer: Self-pay | Admitting: *Deleted

## 2023-11-11 ENCOUNTER — Ambulatory Visit: Payer: BC Managed Care – PPO | Admitting: Urology

## 2023-11-11 LAB — PSA: Prostate Specific Ag, Serum: 3.5 ng/mL (ref 0.0–4.0)

## 2023-11-11 MED ORDER — SILDENAFIL CITRATE 20 MG PO TABS: ORAL_TABLET | ORAL | 1 refills | Status: DC

## 2024-03-27 ENCOUNTER — Other Ambulatory Visit: Payer: Self-pay | Admitting: Urology

## 2024-07-01 ENCOUNTER — Other Ambulatory Visit: Payer: Self-pay | Admitting: Urology

## 2024-10-06 ENCOUNTER — Other Ambulatory Visit: Payer: Self-pay | Admitting: Urology

## 2024-11-02 ENCOUNTER — Other Ambulatory Visit

## 2024-11-07 NOTE — Progress Notes (Unsigned)
 "    1:41 PM   Benjamin Mitchell 12-Nov-1970 [id number]  Referring provider: Auston Reyes BIRCH, MD [address] [facility] [place],  [place] [address]  Urological history: 1. BPH with LUTS -PSA pending -PSA (09/2022) 2.6 -PSA (06/2022) 3.10 -Prostate MRI 05/2020 iPSA 3.3  No findings of high-grade or macroscopic prostate carcinoma.  Heterogeneous T2 signal and early post-contrast enhancement throughout the peripheral zone, most significant in the apex.  Nonspecific, but can be seen with prostatitis.  Prostate volume 23 cc.  PSAD 0.143  2. Testosterone  deficiency -Discontinued therapy due to rising PSA  3. ED - contributing factors of former smoker, age, BPH, testosterone  deficiency, hyperlipidemia and HTN - managed with sildenafil  20 mg, on-demand-dosing  Chief Complaint  Patient presents with   Benign Prostatic Hypertrophy    HPI: Benjamin Mitchell is a 54 y.o. male who presents today for 12 months follow up.    Previous records reviewed.     I PSS 1/0  He reports nocturia x 1.   Patient denies any modifying or aggravating factors.  Patient denies any recent UTI's, gross hematuria, dysuria or suprapubic/flank pain.  Patient denies any fevers, chills, nausea or vomiting.    He does not have a family history of PCa, colon cancer, ovarian cancer, and/or breast cancer.     UA (12/2023) [place]   PSA pending  Serum creatinine (06/2024) 1.1  SHIM 23  Patient still having spontaneous erections.  He denies any pain or curvature with erections.  He is have good response with tadalafil 20 mg on-demand dosage.    PMH: Past Medical History:  Diagnosis Date   BPH with obstruction/lower urinary tract symptoms    HLD (hyperlipidemia)    HTN (hypertension)    Hypogonadism in male     Surgical History: Past Surgical History:  Procedure Laterality Date   COLONOSCOPY WITH PROPOFOL  N/A 03/04/2022   Procedure: COLONOSCOPY WITH PROPOFOL ;  Surgeon: Jinny Carmine, MD;   Location: [facility] ENDOSCOPY;  Service: Endoscopy;  Laterality: N/A;   FOOT FRACTURE SURGERY Bilateral    FRACTURE SURGERY      Home Medications:  Allergies as of 11/08/2024       Reactions   Cardura [doxazosin] Nausea Only   Felt nausea, dizzy and tired         Medication List        Accurate as of November 08, 2024  1:41 PM. If you have any questions, ask your nurse or doctor.          amLODipine 5 MG tablet Commonly known as: NORVASC Take 1 tablet by mouth daily.   hydrochlorothiazide 25 MG tablet Commonly known as: HYDRODIURIL Take 12.5 mg by mouth daily.   losartan 100 MG tablet Commonly known as: COZAAR Take 100 mg by mouth daily.   losartan-hydrochlorothiazide 100-12.5 MG tablet Commonly known as: HYZAAR Take 1 tablet by mouth daily.   rosuvastatin 40 MG tablet Commonly known as: CRESTOR Take 40 mg by mouth daily.   sildenafil  20 MG tablet Commonly known as: REVATIO  TAKE 3-5 TABLETS BY MOUTH ON AN EMPTY STOMACH 2 HOURS PRIOR TO INTERCOURSE **DO NOT TAKE WITH NITRATES**        Allergies:  Allergies  Allergen Reactions   Cardura [Doxazosin] Nausea Only    Felt nausea, dizzy and tired     Family History: Family History  Problem Relation Age of Onset   Stroke Father    Breast cancer Mother    Kidney disease Neg  Hx    Prostate cancer Neg Hx     Social History:  reports that he has quit smoking. He has been exposed to tobacco smoke. He has quit using smokeless tobacco. He reports current alcohol use. He reports that he does not use drugs.  ROS: For pertinent review of systems please refer to history of present illness  Physical Exam: BP 131/87   Pulse 76   Wt 160 lb (72.6 kg)   SpO2 99%   BMI 24.33 kg/m   Constitutional:  Well nourished. Alert and oriented, No acute distress. HEENT: [place] AT, moist mucus membranes.  Trachea midline Cardiovascular: No clubbing, cyanosis, or edema. Respiratory: Normal respiratory effort, no increased work of  breathing. GU: No CVA tenderness.  No bladder fullness or masses.  Patient with circumcised phallus.  Urethral meatus is patent.  No penile discharge. No penile lesions or rashes. Scrotum without lesions, cysts, rashes and/or edema.  Testicles are located scrotally bilaterally. No masses are appreciated in the testicles. Left and right epididymis are normal. Rectal: Patient with  normal sphincter tone. Anus and perineum without scarring or rashes. No rectal masses are appreciated. Prostate is approximately 45 grams, no nodules are appreciated. Seminal vesicles could not be palpated.  Neurologic: Grossly intact, no focal deficits, moving all 4 extremities. Psychiatric: Normal mood and affect.   Laboratory Data: See Epic and HPI   I have reviewed the labs.  Pertinent Imaging N/A  Assessment & Plan:    1. BPH with LUTS -PSA pending  -DRE benign -continue conservative management, avoiding bladder irritants and timed voiding's   2.  ED - sildenafil  20 mg, on-demand-dosing  Return in about 1 year (around 11/08/2025) for PSA, IPSS, SHIM .  CLOTILDA HELON RIGGERS  [facility] [address] [place], [place] [address] [phone]  "

## 2024-11-08 ENCOUNTER — Ambulatory Visit: Payer: Self-pay | Admitting: Urology

## 2024-11-08 ENCOUNTER — Encounter: Payer: Self-pay | Admitting: Urology

## 2024-11-08 VITALS — BP 131/87 | HR 76 | Wt 160.0 lb

## 2024-11-08 DIAGNOSIS — N138 Other obstructive and reflux uropathy: Secondary | ICD-10-CM | POA: Diagnosis not present

## 2024-11-08 DIAGNOSIS — N401 Enlarged prostate with lower urinary tract symptoms: Secondary | ICD-10-CM | POA: Diagnosis not present

## 2024-11-08 DIAGNOSIS — N529 Male erectile dysfunction, unspecified: Secondary | ICD-10-CM

## 2024-11-08 MED ORDER — SILDENAFIL CITRATE 20 MG PO TABS: ORAL_TABLET | ORAL | 1 refills | Status: AC

## 2024-11-09 ENCOUNTER — Ambulatory Visit: Payer: Self-pay | Admitting: Urology

## 2024-11-09 ENCOUNTER — Other Ambulatory Visit: Payer: Self-pay | Admitting: Urology

## 2024-11-09 DIAGNOSIS — R972 Elevated prostate specific antigen [PSA]: Secondary | ICD-10-CM

## 2024-11-09 DIAGNOSIS — N401 Enlarged prostate with lower urinary tract symptoms: Secondary | ICD-10-CM

## 2024-11-09 LAB — PSA: Prostate Specific Ag, Serum: 4.3 ng/mL — ABNORMAL HIGH (ref 0.0–4.0)

## 2024-11-10 NOTE — Telephone Encounter (Signed)
 Spoke with patient in regards to scheduling prostate MRI. Patient aware to contact scheduling to make an appointment. Advised patient of prep prior to having the prostate MRI. Patient verbalized understanding to schedule an appointment to go over results. Advised patient that information will also be added to his Mychart.  Andrea Kirks LPN

## 2024-11-23 ENCOUNTER — Ambulatory Visit
Admission: RE | Admit: 2024-11-23 | Discharge: 2024-11-23 | Disposition: A | Discharge status: Home / Self Care | Source: Ambulatory Visit | Attending: Urology | Admitting: Urology

## 2024-11-23 DIAGNOSIS — R972 Elevated prostate specific antigen [PSA]: Secondary | ICD-10-CM | POA: Insufficient documentation

## 2024-11-23 MED ORDER — GADOBUTROL 1 MMOL/ML IV SOLN
7.5000 mL | Freq: Once | INTRAVENOUS | Status: AC | PRN
  Administered 2024-11-23: 7.5 mL via INTRAVENOUS

## 2024-11-24 ENCOUNTER — Ambulatory Visit: Payer: Self-pay | Admitting: Urology

## 2025-01-12 ENCOUNTER — Other Ambulatory Visit: Admitting: Urology

## 2025-01-19 ENCOUNTER — Ambulatory Visit: Admitting: Urology

## 2025-11-01 ENCOUNTER — Other Ambulatory Visit

## 2025-11-08 ENCOUNTER — Ambulatory Visit: Admitting: Urology
